# Patient Record
Sex: Female | Born: 1935 | Race: Black or African American | Hispanic: No | Marital: Single | Smoking: Never smoker
Health system: Southern US, Community
[De-identification: ages and names within clinical notes are randomized; demographics above are authoritative.]

## PROBLEM LIST (undated history)

## (undated) DIAGNOSIS — G4700 Insomnia, unspecified: Secondary | ICD-10-CM

## (undated) DIAGNOSIS — I10 Essential (primary) hypertension: Secondary | ICD-10-CM

## (undated) DIAGNOSIS — E039 Hypothyroidism, unspecified: Secondary | ICD-10-CM

## (undated) DIAGNOSIS — I509 Heart failure, unspecified: Secondary | ICD-10-CM

## (undated) DIAGNOSIS — K219 Gastro-esophageal reflux disease without esophagitis: Secondary | ICD-10-CM

## (undated) DIAGNOSIS — E119 Type 2 diabetes mellitus without complications: Secondary | ICD-10-CM

---

## 1999-01-19 ENCOUNTER — Ambulatory Visit (HOSPITAL_COMMUNITY): Admission: RE | Admit: 1999-01-19 | Discharge: 1999-01-19 | Payer: Self-pay | Admitting: Internal Medicine

## 1999-01-19 ENCOUNTER — Encounter: Payer: Self-pay | Admitting: Internal Medicine

## 1999-02-04 ENCOUNTER — Ambulatory Visit (HOSPITAL_COMMUNITY): Admission: RE | Admit: 1999-02-04 | Discharge: 1999-02-04 | Payer: Self-pay | Admitting: Gastroenterology

## 1999-02-10 ENCOUNTER — Encounter: Payer: Self-pay | Admitting: Internal Medicine

## 1999-02-10 ENCOUNTER — Ambulatory Visit (HOSPITAL_COMMUNITY): Admission: RE | Admit: 1999-02-10 | Discharge: 1999-02-10 | Payer: Self-pay | Admitting: Internal Medicine

## 2001-04-05 ENCOUNTER — Emergency Department (HOSPITAL_COMMUNITY): Admission: EM | Admit: 2001-04-05 | Discharge: 2001-04-05 | Payer: Self-pay | Admitting: Emergency Medicine

## 2002-01-19 ENCOUNTER — Ambulatory Visit (HOSPITAL_COMMUNITY): Admission: RE | Admit: 2002-01-19 | Discharge: 2002-01-19 | Payer: Self-pay | Admitting: General Surgery

## 2002-01-19 ENCOUNTER — Encounter: Payer: Self-pay | Admitting: General Surgery

## 2002-08-14 ENCOUNTER — Encounter: Payer: Self-pay | Admitting: Internal Medicine

## 2002-08-14 ENCOUNTER — Encounter: Admission: RE | Admit: 2002-08-14 | Discharge: 2002-08-14 | Payer: Self-pay | Admitting: Internal Medicine

## 2002-10-08 ENCOUNTER — Encounter: Payer: Self-pay | Admitting: Internal Medicine

## 2002-10-08 ENCOUNTER — Encounter: Admission: RE | Admit: 2002-10-08 | Discharge: 2002-10-08 | Payer: Self-pay | Admitting: Internal Medicine

## 2002-10-19 ENCOUNTER — Ambulatory Visit (HOSPITAL_COMMUNITY): Admission: RE | Admit: 2002-10-19 | Discharge: 2002-10-19 | Payer: Self-pay | Admitting: Gastroenterology

## 2002-11-19 ENCOUNTER — Ambulatory Visit (HOSPITAL_COMMUNITY): Admission: RE | Admit: 2002-11-19 | Discharge: 2002-11-19 | Payer: Self-pay | Admitting: Gastroenterology

## 2003-10-20 ENCOUNTER — Emergency Department (HOSPITAL_COMMUNITY): Admission: EM | Admit: 2003-10-20 | Discharge: 2003-10-20 | Payer: Self-pay | Admitting: Emergency Medicine

## 2004-08-28 ENCOUNTER — Ambulatory Visit (HOSPITAL_COMMUNITY): Admission: RE | Admit: 2004-08-28 | Discharge: 2004-08-28 | Payer: Self-pay | Admitting: Internal Medicine

## 2005-05-19 ENCOUNTER — Encounter: Admission: RE | Admit: 2005-05-19 | Discharge: 2005-05-19 | Payer: Self-pay | Admitting: Gastroenterology

## 2005-05-21 ENCOUNTER — Ambulatory Visit (HOSPITAL_COMMUNITY): Admission: RE | Admit: 2005-05-21 | Discharge: 2005-05-21 | Payer: Self-pay | Admitting: Gastroenterology

## 2012-02-12 ENCOUNTER — Emergency Department (HOSPITAL_COMMUNITY)
Admission: EM | Admit: 2012-02-12 | Discharge: 2012-02-12 | Disposition: A | Discharge status: Home / Self Care | Payer: Medicare Other | Attending: Emergency Medicine | Admitting: Emergency Medicine

## 2012-02-12 ENCOUNTER — Encounter (HOSPITAL_COMMUNITY): Payer: Self-pay

## 2012-02-12 ENCOUNTER — Emergency Department (HOSPITAL_COMMUNITY): Payer: Medicare Other

## 2012-02-12 DIAGNOSIS — I10 Essential (primary) hypertension: Secondary | ICD-10-CM | POA: Insufficient documentation

## 2012-02-12 DIAGNOSIS — K219 Gastro-esophageal reflux disease without esophagitis: Secondary | ICD-10-CM | POA: Insufficient documentation

## 2012-02-12 DIAGNOSIS — E039 Hypothyroidism, unspecified: Secondary | ICD-10-CM | POA: Insufficient documentation

## 2012-02-12 DIAGNOSIS — Z79899 Other long term (current) drug therapy: Secondary | ICD-10-CM | POA: Insufficient documentation

## 2012-02-12 DIAGNOSIS — N390 Urinary tract infection, site not specified: Secondary | ICD-10-CM

## 2012-02-12 DIAGNOSIS — M549 Dorsalgia, unspecified: Secondary | ICD-10-CM | POA: Insufficient documentation

## 2012-02-12 DIAGNOSIS — M7989 Other specified soft tissue disorders: Secondary | ICD-10-CM | POA: Insufficient documentation

## 2012-02-12 HISTORY — DX: Essential (primary) hypertension: I10

## 2012-02-12 HISTORY — DX: Hypothyroidism, unspecified: E03.9

## 2012-02-12 HISTORY — DX: Gastro-esophageal reflux disease without esophagitis: K21.9

## 2012-02-12 LAB — URINALYSIS, ROUTINE W REFLEX MICROSCOPIC
Hgb urine dipstick: NEGATIVE
Nitrite: POSITIVE — AB
Protein, ur: NEGATIVE mg/dL
Specific Gravity, Urine: 1.017 (ref 1.005–1.030)
Urobilinogen, UA: 1 mg/dL (ref 0.0–1.0)

## 2012-02-12 LAB — URINE MICROSCOPIC-ADD ON

## 2012-02-12 MED ORDER — SULFAMETHOXAZOLE-TRIMETHOPRIM 800-160 MG PO TABS: 1.0000 | ORAL_TABLET | Freq: Two times a day (BID) | ORAL | Status: DC

## 2012-02-12 NOTE — ED Notes (Signed)
YNW:[id number]<[id number]> Expected date:02/12/12<BR> Expected time:12:19 PM<BR> Means of arrival:Ambulance<BR> Comments:<BR> Flank pain

## 2012-02-12 NOTE — ED Notes (Signed)
Per EMS Pt began having left sided flank pain last night. Pt sleeps in her recliner and was moving from the recliner to the bedside commode and noticed the pain. The pain became unbarable with movement this AM. Pt only has pain with movement at this time. Pt hx HTN but did not take her BP medication this AM.

## 2012-02-12 NOTE — ED Provider Notes (Signed)
History     CSN: [id number]  Arrival date & time 02/12/12  1231   First MD Initiated Contact with Patient 02/12/12 1251      Chief Complaint  Patient presents with  . Flank Pain    (Consider location/radiation/quality/duration/timing/severity/associated sxs/prior treatment) HPI Comments: Jeanne Thomas presents for evaluation of back pain.  She reports that she has to go to the bathroom frequently to empty her bladder and late last night she felt a sharp pain while standing up.  She states she has no significant discomfort if she remains still but significant pain when she sits upright.  The pain was greatly reduced after taking "a pain pill".  She denies fever, chest pain, shortness of breath, NVD, palpitations, trauma, and rashes.   Past Medical History  Diagnosis Date  . Hypertension   . GERD (gastroesophageal reflux disease)   . Hypothyroidism     No past surgical history on file.  No family history on file.  History  Substance Use Topics  . Smoking status: Not on file  . Smokeless tobacco: Not on file  . Alcohol Use:     OB History    Grav Para Term Preterm Abortions TAB SAB Ect Mult Living                  Review of Systems  Constitutional: Negative for fever, activity change, appetite change and fatigue.  HENT: Negative for congestion, sore throat, rhinorrhea, trouble swallowing and neck pain.   Eyes: Negative.   Respiratory: Negative for cough, choking, chest tightness and shortness of breath.   Cardiovascular: Positive for leg swelling (Chronic). Negative for chest pain and palpitations.  Gastrointestinal: Negative for nausea, vomiting, abdominal pain, diarrhea and abdominal distention.  Genitourinary: Positive for frequency. Negative for dysuria, urgency, hematuria, flank pain, decreased urine volume, difficulty urinating and dyspareunia.  Musculoskeletal: Positive for back pain and arthralgias. Negative for joint swelling and gait problem.  Neurological:  Negative.  Negative for syncope, weakness and light-headedness.  Psychiatric/Behavioral: Negative.     Allergies  Review of patient's allergies indicates no known allergies.  Home Medications   Current Outpatient Rx  Name Route Sig Dispense Refill  . VITAMIN D3 2000 UNITS PO TABS Oral Take 2,000 mg by mouth daily.    . FUROSEMIDE 20 MG PO TABS Oral Take 20 mg by mouth 2 (two) times daily.    Marland Kitchen LEVOTHYROXINE SODIUM 25 MCG PO TABS Oral Take 25 mcg by mouth daily.    Marland Kitchen METOPROLOL SUCCINATE ER 25 MG PO TB24 Oral Take 25 mg by mouth 2 (two) times daily.    Marland Kitchen OMEPRAZOLE 20 MG PO CPDR Oral Take 40 mg by mouth daily.    Marland Kitchen POTASSIUM CHLORIDE CRYS ER 20 MEQ PO TBCR Oral Take 20 mEq by mouth daily.    Bernadette Hoit SODIUM 8.6-50 MG PO TABS Oral Take 1 tablet by mouth daily.    . TRAMADOL HCL 50 MG PO TABS Oral Take 50 mg by mouth every 12 (twelve) hours as needed. pain      BP 143/59  Pulse 91  Temp 97.8 F (36.6 C) (Oral)  SpO2 93%  Physical Exam  Nursing note and vitals reviewed. Constitutional: She is oriented to person, place, and time. She appears well-developed and well-nourished. No distress.  HENT:  Head: Normocephalic and atraumatic.  Right Ear: External ear normal.  Left Ear: External ear normal.  Nose: Nose normal.  Mouth/Throat: Oropharynx is clear and moist. No oropharyngeal exudate.  Eyes:  Conjunctivae normal are normal. Pupils are equal, round, and reactive to light. Right eye exhibits no discharge. Left eye exhibits no discharge. No scleral icterus.  Neck: Normal range of motion. Neck supple. No JVD present. No tracheal deviation present.  Cardiovascular: Normal rate, regular rhythm, normal heart sounds and intact distal pulses.  Exam reveals no gallop and no friction rub.   No murmur heard. Pulmonary/Chest: Breath sounds normal. No stridor. No respiratory distress. She has no wheezes. She has no rales. She exhibits no tenderness.  Abdominal: Soft. Bowel sounds  are normal. She exhibits no distension and no mass. There is no tenderness. There is no rebound and no guarding.  Musculoskeletal: Normal range of motion. She exhibits edema (1+ bilateral, symmetric).       Arms: Lymphadenopathy:    She has no cervical adenopathy.  Neurological: She is alert and oriented to person, place, and time. No cranial nerve deficit.  Skin: Skin is warm and dry. No rash noted. She is not diaphoretic. No erythema. No pallor.  Psychiatric: She has a normal mood and affect. Her behavior is normal.    ED Course  Procedures (including critical care time)   Labs Reviewed  URINALYSIS, ROUTINE W REFLEX MICROSCOPIC   No results found.   No diagnosis found.    MDM  Pt presents for evaluation of back pain.  She reports that she has to go to the bathroom frequently to empty her bladder and late last night she felt a sharp pain while standing up.  She states she has no significant discomfort if she remains still but significant pain when she sits upright.  Note stable VS, NAD.  Plan urinalysis and CXR.  Will reassess.  1520.  Pt stable, NAD.  Plan treat for UTI with Bactrim.  Urine culture has been sent for further evaluation.  She appears comfortable and her pain was improved after taking the ultram she has been previously prescribed.  Plan discharge home.      Tobin Chad, MD 02/12/12 1525

## 2012-02-14 LAB — URINE CULTURE

## 2012-02-15 NOTE — ED Notes (Signed)
+   Urine Patient treated with Bactrim-Sensitive to same-chart appended per protocol MD.

## 2012-03-17 ENCOUNTER — Emergency Department (HOSPITAL_COMMUNITY): Payer: Medicare Other

## 2012-03-17 ENCOUNTER — Emergency Department (HOSPITAL_COMMUNITY)
Admission: EM | Admit: 2012-03-17 | Discharge: 2012-03-17 | Disposition: A | Discharge status: Home / Self Care | Payer: Medicare Other | Attending: Emergency Medicine | Admitting: Emergency Medicine

## 2012-03-17 ENCOUNTER — Encounter (HOSPITAL_COMMUNITY): Payer: Self-pay | Admitting: Emergency Medicine

## 2012-03-17 DIAGNOSIS — J3489 Other specified disorders of nose and nasal sinuses: Secondary | ICD-10-CM | POA: Insufficient documentation

## 2012-03-17 DIAGNOSIS — J069 Acute upper respiratory infection, unspecified: Secondary | ICD-10-CM | POA: Insufficient documentation

## 2012-03-17 DIAGNOSIS — J329 Chronic sinusitis, unspecified: Secondary | ICD-10-CM | POA: Insufficient documentation

## 2012-03-17 DIAGNOSIS — R040 Epistaxis: Secondary | ICD-10-CM | POA: Insufficient documentation

## 2012-03-17 DIAGNOSIS — R6883 Chills (without fever): Secondary | ICD-10-CM | POA: Insufficient documentation

## 2012-03-17 DIAGNOSIS — K219 Gastro-esophageal reflux disease without esophagitis: Secondary | ICD-10-CM | POA: Insufficient documentation

## 2012-03-17 DIAGNOSIS — IMO0001 Reserved for inherently not codable concepts without codable children: Secondary | ICD-10-CM | POA: Insufficient documentation

## 2012-03-17 DIAGNOSIS — I10 Essential (primary) hypertension: Secondary | ICD-10-CM | POA: Insufficient documentation

## 2012-03-17 DIAGNOSIS — J029 Acute pharyngitis, unspecified: Secondary | ICD-10-CM | POA: Insufficient documentation

## 2012-03-17 DIAGNOSIS — R5383 Other fatigue: Secondary | ICD-10-CM | POA: Insufficient documentation

## 2012-03-17 DIAGNOSIS — R5381 Other malaise: Secondary | ICD-10-CM | POA: Insufficient documentation

## 2012-03-17 DIAGNOSIS — E039 Hypothyroidism, unspecified: Secondary | ICD-10-CM | POA: Insufficient documentation

## 2012-03-17 DIAGNOSIS — M7989 Other specified soft tissue disorders: Secondary | ICD-10-CM | POA: Insufficient documentation

## 2012-03-17 LAB — URINALYSIS, ROUTINE W REFLEX MICROSCOPIC
Bilirubin Urine: NEGATIVE
Glucose, UA: NEGATIVE mg/dL
Ketones, ur: NEGATIVE mg/dL
Protein, ur: NEGATIVE mg/dL
pH: 6.5 (ref 5.0–8.0)

## 2012-03-17 LAB — COMPREHENSIVE METABOLIC PANEL
ALT: 10 U/L (ref 0–35)
Albumin: 4.3 g/dL (ref 3.5–5.2)
Alkaline Phosphatase: 80 U/L (ref 39–117)
BUN: 13 mg/dL (ref 6–23)
Chloride: 95 mEq/L — ABNORMAL LOW (ref 96–112)
GFR calc Af Amer: >90 mL/min (ref >90)
Glucose, Bld: 128 mg/dL — ABNORMAL HIGH (ref 70–99)
Potassium: 3.9 mEq/L (ref 3.5–5.1)
Sodium: 135 mEq/L (ref 135–145)
Total Bilirubin: 0.6 mg/dL (ref 0.3–1.2)
Total Protein: 8.2 g/dL (ref 6.0–8.3)

## 2012-03-17 LAB — CBC WITH DIFFERENTIAL/PLATELET
Eosinophils Absolute: 0.1 10*3/uL (ref 0.0–0.7)
Hemoglobin: 13 g/dL (ref 12.0–15.0)
Lymphs Abs: 1.5 10*3/uL (ref 0.7–4.0)
Monocytes Relative: 12 % (ref 3–12)
Neutro Abs: 3.8 10*3/uL (ref 1.7–7.7)
Neutrophils Relative %: 62 % (ref 43–77)
Platelets: 208 10*3/uL (ref 150–400)
RBC: 4.16 MIL/uL (ref 3.87–5.11)
WBC: 6.1 10*3/uL (ref 4.0–10.5)

## 2012-03-17 MED ORDER — FLUTICASONE PROPIONATE 50 MCG/ACT NA SUSP: 2.0000 | Freq: Every day | NASAL | Status: DC

## 2012-03-17 MED ORDER — CETIRIZINE-PSEUDOEPHEDRINE ER 5-120 MG PO TB12: 1.0000 | ORAL_TABLET | Freq: Every day | ORAL | Status: DC

## 2012-03-17 NOTE — ED Notes (Addendum)
Pt from home.  Pt has had issue with her sinuses and feels that her ear is stopped up.  Pt has had ear irrigated in the past before.  Pt also has had increased swelling in both her feet.  Pt on lasix for htn and "fluid around the heart."  Pt also says she has a cough, but states it is the same

## 2012-03-17 NOTE — ED Provider Notes (Signed)
See prior note   Ward Givens, MD 03/17/12 1733

## 2012-03-17 NOTE — ED Provider Notes (Signed)
Patient reports for the past couple days she has had a pressure in both her ears and has a stuffy nose without rhinorrhea. She states when she clears her throat she coughs up some phlegm although she's not coughing. She denies shortness of breath or chest pain. She states she has urinary frequency however she was recently started on Lasix and states she appears to be urinating less since she was started on the Lasix. She relates she's had some chills either while she was in the ED. They are unsure of any fever.  Patient is alert and cooperative sitting in a wheelchair she is not in any respiratory distress, she has no obvious rhinorrhea.  Medical screening examination/treatment/procedure(s) were conducted as a shared visit with non-physician practitioner(s) and myself.  I personally evaluated the patient during the encounter Devoria Albe, MD, Franz Dell, MD 03/17/12 [phone]

## 2012-03-17 NOTE — Progress Notes (Signed)
Pt confirms pcp is henry tripp States generally seen by his Charity fundraiser. EPIC updated

## 2012-03-17 NOTE — ED Provider Notes (Signed)
History     CSN: [id number]  Arrival date & time 03/17/12  1424   First MD Initiated Contact with Patient 03/17/12 1508      Chief Complaint  Patient presents with  . Otalgia  . Foot Swelling    (Consider location/radiation/quality/duration/timing/severity/associated sxs/prior treatment) HPI Jeanne Thomas is a 78 y.o. female who presents here from home with complaint of ear pain and itching, nasal congestion, cough. Pt states she has felt "bad" last several days. Pt states had similar ear problem before, and was told it was ear wax. She has used over the counter ear wax drops with no relief. Pt also reports chronic sinus problems, not taking any medications for it. States that she has been sneezing a lot. Also reports sore throat and mild non productive cough. States legs have been swelling more than usual, and no relief with lasix which normally helps. Pt denies fever, chills. No n/v/d. No headache, chest pain, abdominal pain, urinary symptoms.    Past Medical History  Diagnosis Date  . Hypertension   . GERD (gastroesophageal reflux disease)   . Hypothyroidism     History reviewed. No pertinent past surgical history.  History reviewed. No pertinent family history.  History  Substance Use Topics  . Smoking status: Never Smoker   . Smokeless tobacco: Not on file  . Alcohol Use: No    OB History    Grav Para Term Preterm Abortions TAB SAB Ect Mult Living                  Review of Systems  Constitutional: Positive for chills and fatigue. Negative for fever.  HENT: Positive for ear pain, nosebleeds, congestion, sore throat and sneezing. Negative for hearing loss, trouble swallowing, neck pain, neck stiffness, voice change and ear discharge.   Eyes: Negative for discharge and itching.  Cardiovascular: Positive for leg swelling. Negative for chest pain and palpitations.  Gastrointestinal: Negative for nausea, vomiting, diarrhea, constipation and abdominal distention.    Genitourinary: Negative for dysuria and flank pain.  Musculoskeletal: Positive for myalgias.  Skin: Negative.   Neurological: Positive for weakness. Negative for dizziness, numbness and headaches.  Psychiatric/Behavioral: Negative.     Allergies  Review of patient's allergies indicates no known allergies.  Home Medications   Current Outpatient Rx  Name  Route  Sig  Dispense  Refill  . VITAMIN D3 2000 UNITS PO TABS   Oral   Take 2,000 mg by mouth daily.         . FUROSEMIDE 20 MG PO TABS   Oral   Take 20 mg by mouth 2 (two) times daily.         Marland Kitchen LEVOTHYROXINE SODIUM 25 MCG PO TABS   Oral   Take 25 mcg by mouth daily.         Marland Kitchen METOPROLOL SUCCINATE ER 25 MG PO TB24   Oral   Take 25 mg by mouth 2 (two) times daily.         Marland Kitchen OMEPRAZOLE 20 MG PO CPDR   Oral   Take 40 mg by mouth daily.         Marland Kitchen POTASSIUM CHLORIDE CRYS ER 20 MEQ PO TBCR   Oral   Take 20 mEq by mouth daily.         Bernadette Hoit SODIUM 8.6-50 MG PO TABS   Oral   Take 1 tablet by mouth daily.         . TRAMADOL HCL 50 MG  PO TABS   Oral   Take 50 mg by mouth every 12 (twelve) hours as needed. pain           BP 170/73  Pulse 102  Temp 97.5 F (36.4 C) (Oral)  Resp 16  SpO2 97%  Physical Exam  Nursing note and vitals reviewed. Constitutional: She appears well-developed and well-nourished. No distress.  HENT:  Head: Normocephalic and atraumatic.  Right Ear: Tympanic membrane, external ear and ear canal normal.  Left Ear: Tympanic membrane, external ear and ear canal normal.  Nose: Rhinorrhea present. Right sinus exhibits no maxillary sinus tenderness and no frontal sinus tenderness. Left sinus exhibits no maxillary sinus tenderness and no frontal sinus tenderness.  Mouth/Throat: Uvula is midline, oropharynx is clear and moist and mucous membranes are normal. No oropharyngeal exudate, posterior oropharyngeal edema, posterior oropharyngeal erythema or tonsillar abscesses.   Eyes: Conjunctivae normal are normal.  Neck: Neck supple.  Cardiovascular: Normal rate, regular rhythm and normal heart sounds.   Pulmonary/Chest: Effort normal and breath sounds normal. No respiratory distress. She has no wheezes. She has no rales.  Abdominal: Soft. Bowel sounds are normal. She exhibits no distension. There is no tenderness. There is no rebound.  Musculoskeletal:       Non pitting LE edema bilaterally  Neurological: She is alert.  Skin: Skin is warm and dry.  Psychiatric: She has a normal mood and affect. Her behavior is normal.    ED Course  Procedures (including critical care time)  Results for orders placed during the hospital encounter of 03/17/12  CBC WITH DIFFERENTIAL      Component Value Range   WBC 6.1  4.0 - 10.5 K/uL   RBC 4.16  3.87 - 5.11 MIL/uL   Hemoglobin 13.0  12.0 - 15.0 g/dL   HCT 16.1  09.6 - 04.5 %   MCV 91.8  78.0 - 100.0 fL   MCH 31.3  26.0 - 34.0 pg   MCHC 34.0  30.0 - 36.0 g/dL   RDW 40.9  81.1 - 91.4 %   Platelets 208  150 - 400 K/uL   Neutrophils Relative 62  43 - 77 %   Neutro Abs 3.8  1.7 - 7.7 K/uL   Lymphocytes Relative 24  12 - 46 %   Lymphs Abs 1.5  0.7 - 4.0 K/uL   Monocytes Relative 12  3 - 12 %   Monocytes Absolute 0.7  0.1 - 1.0 K/uL   Eosinophils Relative 2  0 - 5 %   Eosinophils Absolute 0.1  0.0 - 0.7 K/uL   Basophils Relative 1  0 - 1 %   Basophils Absolute 0.1  0.0 - 0.1 K/uL  COMPREHENSIVE METABOLIC PANEL      Component Value Range   Sodium 135  135 - 145 mEq/L   Potassium 3.9  3.5 - 5.1 mEq/L   Chloride 95 (*) 96 - 112 mEq/L   CO2 27  19 - 32 mEq/L   Glucose, Bld 128 (*) 70 - 99 mg/dL   BUN 13  6 - 23 mg/dL   Creatinine, Ser 7.82  0.50 - 1.10 mg/dL   Calcium 95.6  8.4 - 21.3 mg/dL   Total Protein 8.2  6.0 - 8.3 g/dL   Albumin 4.3  3.5 - 5.2 g/dL   AST 17  0 - 37 U/L   ALT 10  0 - 35 U/L   Alkaline Phosphatase 80  39 - 117 U/L   Total Bilirubin 0.6  0.3 - 1.2 mg/dL   GFR calc non Af Amer 80 (*) >90  mL/min   GFR calc Af Amer >90  >90 mL/min  URINALYSIS, ROUTINE W REFLEX MICROSCOPIC      Component Value Range   Color, Urine YELLOW  YELLOW   APPearance CLEAR  CLEAR   Specific Gravity, Urine 1.007  1.005 - 1.030   pH 6.5  5.0 - 8.0   Glucose, UA NEGATIVE  NEGATIVE mg/dL   Hgb urine dipstick NEGATIVE  NEGATIVE   Bilirubin Urine NEGATIVE  NEGATIVE   Ketones, ur NEGATIVE  NEGATIVE mg/dL   Protein, ur NEGATIVE  NEGATIVE mg/dL   Urobilinogen, UA 0.2  0.0 - 1.0 mg/dL   Nitrite NEGATIVE  NEGATIVE   Leukocytes, UA NEGATIVE  NEGATIVE   Dg Chest 2 View  03/17/2012  *RADIOLOGY REPORT*  Clinical Data: Shortness of breath and cough.  CHEST - 2 VIEW  Comparison: Plain films of the chest 02/12/2012.  Findings: There is cardiomegaly but no edema.  Lungs are clear.  No pneumothorax or pleural fluid.  Marked degenerative change about the shoulders and multilevel thoracic spondylosis noted.  IMPRESSION: Cardiomegaly without acute disease.   Original Report Authenticated By: Holley Dexter, M.D.     Filed Vitals:   03/17/12 1658  BP: 128/59  Pulse: 85  Temp:   Resp: 16     1. URI (upper respiratory infection)   2. Sinusitis       MDM  Pt with URI symptoms, cough, chills. VS normal here. She is non toxic. Labs and UA negative. Chest x-ray negative. I suspect pt has a viral URI. Will start on zyrtec, flonase. Will follow up with PCP.         Lottie Mussel, PA 03/17/12 1719

## 2012-04-29 ENCOUNTER — Encounter (HOSPITAL_COMMUNITY): Payer: Self-pay | Admitting: Emergency Medicine

## 2012-04-29 ENCOUNTER — Observation Stay (HOSPITAL_COMMUNITY): Payer: Medicare Other

## 2012-04-29 ENCOUNTER — Inpatient Hospital Stay (HOSPITAL_COMMUNITY)
Admission: EM | Admit: 2012-04-29 | Discharge: 2012-05-03 | DRG: 490 | Disposition: A | Discharge status: Skilled Nursing Facility | Payer: Medicare Other | Attending: Internal Medicine | Admitting: Internal Medicine

## 2012-04-29 ENCOUNTER — Emergency Department (HOSPITAL_COMMUNITY): Payer: Medicare Other

## 2012-04-29 DIAGNOSIS — E119 Type 2 diabetes mellitus without complications: Secondary | ICD-10-CM

## 2012-04-29 DIAGNOSIS — M4626 Osteomyelitis of vertebra, lumbar region: Secondary | ICD-10-CM | POA: Insufficient documentation

## 2012-04-29 DIAGNOSIS — R531 Weakness: Secondary | ICD-10-CM

## 2012-04-29 DIAGNOSIS — M549 Dorsalgia, unspecified: Secondary | ICD-10-CM

## 2012-04-29 DIAGNOSIS — E049 Nontoxic goiter, unspecified: Secondary | ICD-10-CM | POA: Diagnosis present

## 2012-04-29 DIAGNOSIS — M4640 Discitis, unspecified, site unspecified: Secondary | ICD-10-CM

## 2012-04-29 DIAGNOSIS — E669 Obesity, unspecified: Secondary | ICD-10-CM | POA: Diagnosis present

## 2012-04-29 DIAGNOSIS — K219 Gastro-esophageal reflux disease without esophagitis: Secondary | ICD-10-CM

## 2012-04-29 DIAGNOSIS — I5032 Chronic diastolic (congestive) heart failure: Secondary | ICD-10-CM

## 2012-04-29 DIAGNOSIS — E039 Hypothyroidism, unspecified: Secondary | ICD-10-CM

## 2012-04-29 DIAGNOSIS — I10 Essential (primary) hypertension: Secondary | ICD-10-CM

## 2012-04-29 DIAGNOSIS — M519 Unspecified thoracic, thoracolumbar and lumbosacral intervertebral disc disorder: Principal | ICD-10-CM | POA: Diagnosis present

## 2012-04-29 LAB — CBC
Hemoglobin: 12.2 g/dL (ref 12.0–15.0)
MCH: 29.8 pg (ref 26.0–34.0)
MCV: 92.2 fL (ref 78.0–100.0)
RBC: 4.09 MIL/uL (ref 3.87–5.11)

## 2012-04-29 LAB — CBC WITH DIFFERENTIAL/PLATELET
Basophils Absolute: 0.1 10*3/uL (ref 0.0–0.1)
Basophils Relative: 1 % (ref 0–1)
Lymphocytes Relative: 16 % (ref 12–46)
Neutro Abs: 6.4 10*3/uL (ref 1.7–7.7)
Platelets: 188 10*3/uL (ref 150–400)
RDW: 14.8 % (ref 11.5–15.5)
WBC: 9.3 10*3/uL (ref 4.0–10.5)

## 2012-04-29 LAB — COMPREHENSIVE METABOLIC PANEL
ALT: 8 U/L (ref 0–35)
AST: 19 U/L (ref 0–37)
Albumin: 3.9 g/dL (ref 3.5–5.2)
CO2: 27 mEq/L (ref 19–32)
Calcium: 10.1 mg/dL (ref 8.4–10.5)
Chloride: 94 mEq/L — ABNORMAL LOW (ref 96–112)
GFR calc non Af Amer: 78 mL/min — ABNORMAL LOW (ref >90)
Sodium: 133 mEq/L — ABNORMAL LOW (ref 135–145)
Total Bilirubin: 0.8 mg/dL (ref 0.3–1.2)

## 2012-04-29 LAB — URINALYSIS, ROUTINE W REFLEX MICROSCOPIC
Bilirubin Urine: NEGATIVE
Hgb urine dipstick: NEGATIVE
Ketones, ur: NEGATIVE mg/dL
Leukocytes, UA: NEGATIVE
Nitrite: NEGATIVE
Specific Gravity, Urine: 1.018 (ref 1.005–1.030)
pH: 6 (ref 5.0–8.0)

## 2012-04-29 LAB — CREATININE, SERUM: Creatinine, Ser: 0.51 mg/dL (ref 0.50–1.10)

## 2012-04-29 MED ORDER — FUROSEMIDE 20 MG PO TABS
20.0000 mg | ORAL_TABLET | Freq: Two times a day (BID) | ORAL | Status: DC
  Administered 2012-04-29 – 2012-05-03 (×8): 20 mg via ORAL
  Filled 2012-04-29 (×9): qty 1

## 2012-04-29 MED ORDER — TRAMADOL HCL 50 MG PO TABS
50.0000 mg | ORAL_TABLET | Freq: Two times a day (BID) | ORAL | Status: DC | PRN
  Administered 2012-04-29: 50 mg via ORAL
  Filled 2012-04-29 (×2): qty 1

## 2012-04-29 MED ORDER — METOPROLOL SUCCINATE ER 25 MG PO TB24
25.0000 mg | ORAL_TABLET | Freq: Two times a day (BID) | ORAL | Status: DC
  Administered 2012-04-29 – 2012-05-03 (×8): 25 mg via ORAL
  Filled 2012-04-29 (×9): qty 1

## 2012-04-29 MED ORDER — SENNOSIDES-DOCUSATE SODIUM 8.6-50 MG PO TABS
1.0000 | ORAL_TABLET | Freq: Every day | ORAL | Status: DC
  Administered 2012-04-30 – 2012-05-02 (×3): 1 via ORAL
  Filled 2012-04-29 (×3): qty 1

## 2012-04-29 MED ORDER — ENOXAPARIN SODIUM 30 MG/0.3ML ~~LOC~~ SOLN
30.0000 mg | SUBCUTANEOUS | Status: DC
  Administered 2012-04-29: 30 mg via SUBCUTANEOUS
  Filled 2012-04-29 (×2): qty 0.3

## 2012-04-29 MED ORDER — ACETAMINOPHEN 325 MG PO TABS
650.0000 mg | ORAL_TABLET | Freq: Four times a day (QID) | ORAL | Status: DC | PRN
  Administered 2012-04-30: 650 mg via ORAL
  Filled 2012-04-29: qty 2

## 2012-04-29 MED ORDER — ONDANSETRON HCL 4 MG PO TABS: 4.0000 mg | ORAL_TABLET | Freq: Four times a day (QID) | ORAL | Status: DC | PRN

## 2012-04-29 MED ORDER — AMLODIPINE-OLMESARTAN 5-20 MG PO TABS: 1.0000 | ORAL_TABLET | Freq: Every day | ORAL | Status: DC

## 2012-04-29 MED ORDER — ONDANSETRON HCL 4 MG/2ML IJ SOLN: 4.0000 mg | Freq: Four times a day (QID) | INTRAMUSCULAR | Status: DC | PRN

## 2012-04-29 MED ORDER — AMLODIPINE BESYLATE 5 MG PO TABS
5.0000 mg | ORAL_TABLET | Freq: Every day | ORAL | Status: DC
  Administered 2012-04-29 – 2012-05-03 (×5): 5 mg via ORAL
  Filled 2012-04-29 (×5): qty 1

## 2012-04-29 MED ORDER — IRBESARTAN 150 MG PO TABS
150.0000 mg | ORAL_TABLET | Freq: Every day | ORAL | Status: DC
  Administered 2012-04-29 – 2012-05-03 (×5): 150 mg via ORAL
  Filled 2012-04-29 (×5): qty 1

## 2012-04-29 MED ORDER — LEVOTHYROXINE SODIUM 25 MCG PO TABS
25.0000 ug | ORAL_TABLET | Freq: Every day | ORAL | Status: DC
  Administered 2012-04-30 – 2012-05-03 (×3): 25 ug via ORAL
  Filled 2012-04-29 (×5): qty 1

## 2012-04-29 MED ORDER — OXYCODONE HCL 5 MG PO TABS: 5.0000 mg | ORAL_TABLET | ORAL | Status: DC | PRN

## 2012-04-29 MED ORDER — FLUTICASONE PROPIONATE 50 MCG/ACT NA SUSP
2.0000 | Freq: Every day | NASAL | Status: DC
  Administered 2012-04-29 – 2012-05-03 (×5): 2 via NASAL
  Filled 2012-04-29: qty 16

## 2012-04-29 MED ORDER — PANTOPRAZOLE SODIUM 40 MG PO TBEC
40.0000 mg | DELAYED_RELEASE_TABLET | Freq: Every day | ORAL | Status: DC
  Administered 2012-04-30 – 2012-05-03 (×4): 40 mg via ORAL
  Filled 2012-04-29 (×4): qty 1

## 2012-04-29 MED ORDER — ACETAMINOPHEN 650 MG RE SUPP: 650.0000 mg | Freq: Four times a day (QID) | RECTAL | Status: DC | PRN

## 2012-04-29 NOTE — ED Notes (Signed)
Patient cleaned and changed bed.

## 2012-04-29 NOTE — ED Notes (Signed)
Per EMS - pt reports last night she started to have some UTI symptoms along with lower back pain. Pt also c/o ear pain. Pt a&Ox4. BP 140/76 HR 84 RR 14.

## 2012-04-29 NOTE — ED Notes (Signed)
Lab at bedside collecting blood samples.  

## 2012-04-29 NOTE — ED Notes (Signed)
Patient transported to X-ray 

## 2012-04-29 NOTE — ED Notes (Signed)
MD at bedside, admission Md

## 2012-04-29 NOTE — ED Notes (Signed)
No Admitting orders

## 2012-04-29 NOTE — ED Provider Notes (Signed)
2:07 PM  Date: 04/29/2012  Rate: 86  Rhythm: normal sinus rhythm  QRS Axis: normal  Intervals: normal QRS:  Poor R wave progression in precordial leads suggests old anterior myocardial infarction.  Left atrial abnormality.  ST/T Wave abnormalities: normal  Conduction Disutrbances:none  Narrative Interpretation: Borderline EKG  Old EKG Reviewed: changes noted--poor R wave progression in precordial leads is new since tracing of 10/20/2003.    Carleene Cooper III, MD 04/29/12 1425

## 2012-04-29 NOTE — ED Provider Notes (Signed)
History     CSN: [id number]  Arrival date & time 04/29/12  1256   First MD Initiated Contact with Patient 04/29/12 1324      Chief Complaint  Patient presents with  . Urinary Tract Infection    (Consider location/radiation/quality/duration/timing/severity/associated sxs/prior treatment) HPI 79 year old female presents to the emergency department with multiple complaints.  Patient states that she has lower back pain and burning with urination.  Pain is predominantly in the right flank.   She has had increased weakness. She is normally able to wak She has multiple joint complaints including her left knee, and left neck.  Today she had so much pain in her left knee and back that hat she was unable to get out of her recliner  to urinate.  The patient lives alone.  Her sister who is unable to physically lift her from the floor or bad takes care of her in the evenings.  She has a Press photographer who sits with her approximately 5 hours a day.  The patient does have a primary care physician, however she is unable to leave the house or getting in cars due to her inability to ambulate or lift her leg into the car.  She states that she has not seen her primary care physician in years.  He generally since nurses to her home for care. Patient denies any chest pain shortness of breath.  She does have bilateral peripheral edema.  She denies any altered mental status.  Family agrees that she is at baseline.  .  Past Medical History  Diagnosis Date  . Hypertension   . GERD (gastroesophageal reflux disease)   . Hypothyroidism     History reviewed. No pertinent past surgical history.  No family history on file.  History  Substance Use Topics  . Smoking status: Never Smoker   . Smokeless tobacco: Not on file  . Alcohol Use: No    OB History    Grav Para Term Preterm Abortions TAB SAB Ect Mult Living                  Review of Systems Ten systems reviewed and are negative for acute  change, except as noted in the HPI.   Allergies  Review of patient's allergies indicates no known allergies.  Home Medications   Current Outpatient Rx  Name  Route  Sig  Dispense  Refill  . AMLODIPINE-OLMESARTAN 5-20 MG PO TABS   Oral   Take 1 tablet by mouth daily.         Marland Kitchen VITAMIN D3 2000 UNITS PO TABS   Oral   Take 2,000 mg by mouth daily.         Marland Kitchen FLUTICASONE PROPIONATE 50 MCG/ACT NA SUSP   Nasal   Place 2 sprays into the nose daily.   16 g   1   . FUROSEMIDE 20 MG PO TABS   Oral   Take 20 mg by mouth 2 (two) times daily.         Marland Kitchen LEVOTHYROXINE SODIUM 25 MCG PO TABS   Oral   Take 25 mcg by mouth daily.         Marland Kitchen METOPROLOL SUCCINATE ER 25 MG PO TB24   Oral   Take 25 mg by mouth 2 (two) times daily.         Marland Kitchen OMEPRAZOLE 20 MG PO CPDR   Oral   Take 40 mg by mouth daily.         Marland Kitchen  POTASSIUM CHLORIDE CRYS ER 20 MEQ PO TBCR   Oral   Take 20 mEq by mouth daily.         Bernadette Hoit SODIUM 8.6-50 MG PO TABS   Oral   Take 1 tablet by mouth daily.         . TRAMADOL HCL 50 MG PO TABS   Oral   Take 50 mg by mouth every 12 (twelve) hours as needed. pain           BP 154/98  Pulse 89  Temp 98.1 F (36.7 C) (Oral)  Resp 22  SpO2 94%  Physical Exam  Constitutional: She is oriented to person, place, and time. She appears well-developed and well-nourished. No distress.  HENT:  Head: Normocephalic and atraumatic.  Eyes: Conjunctivae normal are normal. No scleral icterus.  Neck: Normal range of motion.  Cardiovascular: Normal rate, regular rhythm and normal heart sounds.  Exam reveals no gallop and no friction rub.   No murmur heard.      BL pitting edema of the lower extremities   Pulmonary/Chest: Effort normal and breath sounds normal. No respiratory distress.  Abdominal: Soft. Bowel sounds are normal. She exhibits no distension and no mass. There is no tenderness. There is no guarding.  Neurological: She is alert and oriented  to person, place, and time.  Skin: Skin is warm and dry. She is not diaphoretic.    ED Course  Procedures (including critical care time)  Labs Reviewed  CBC WITH DIFFERENTIAL - Abnormal; Notable for the following:    Monocytes Relative 13 (*)     Monocytes Absolute 1.2 (*)     All other components within normal limits  COMPREHENSIVE METABOLIC PANEL - Abnormal; Notable for the following:    Sodium 133 (*)     Chloride 94 (*)     Glucose, Bld 121 (*)     GFR calc non Af Amer 78 (*)     GFR calc Af Amer 90 (*)     All other components within normal limits  PRO B NATRIURETIC PEPTIDE - Abnormal; Notable for the following:    Pro B Natriuretic peptide (BNP) 580.5 (*)     All other components within normal limits  URINALYSIS, ROUTINE W REFLEX MICROSCOPIC   No results found.   1. Weakness   2. Acute back pain   3. Chronic diastolic heart failure   4. HTN (hypertension)   5. GERD (gastroesophageal reflux disease)       MDM  2:38 PM Filed Vitals:   04/29/12 1432  BP: 147/66  Pulse: 85  Temp:   Resp: 17   Patient with labs that are unremarkable.  She has multiple joint complaints, howver nothing seems acute and she has known arthritis.  The patient is unable to ambulate and does not feel safe to go home.  I have spoken with Dr. Rito Ehrlich who has agreed to admit the patient for observation and social work consult. Patient seen in shared visit with Dr. Ignacia Palma. The patient appears reasonably stabilized for admission considering the current resources, flow, and capabilities available in the ED at this time, and I doubt any other [facility] requiring further screening and/or treatment in the ED prior to admission.        Arthor Captain, PA-C 04/30/12 2114

## 2012-04-29 NOTE — H&P (Signed)
[facility] History and Physical  Jeanne Thomas ZOX:[id number] DOB: 1917/07/25 DOA: 04/29/2012  Referring physician: Arthor Captain, ER PA PCP: Florentina Jenny, MD  Specialists: None  Chief Complaint: Back pain  HPI: Jeanne Thomas is a 79 y.o. female  With past medical history of hypothyroidism, hypertension and diastolic heart failure who lives at home by herself. She normally spends most of her day in a chair, but is able to get up and with the use of the walker ambulate to the bathroom. For the past few days she's had worsening back pain in her lower back to the point where she cannot even get up. Her sister who lives close by visited her and was unable to help her get up. She brought in by ambulance to the emergency room. In the emergency room urinalysis was unremarkable. Initial blood work and chest x-ray unremarkable. Spinal x-rays were not done. Vital signs were noted to be stable. Hospitalists were called for further evaluation.  Review of Systems: When I saw the patient in the emergency room, she was doing okay. She denied any headaches, vision changes, dysphasia, chest pain, palpitations, shortness of breath, wheeze, cough, abdominal pain, hematuria. Patient complains of occasional urinary incontinence. She denies any diarrhea. She does have occasional episodes of constipation. She denies any lower extremity numbness but does have problems with lower from a swelling. She does complain of some pain in her lower back which is acute although she admits she's had this before. Her review of systems otherwise negative.  Past Medical History  Diagnosis Date  . Hypertension   . GERD (gastroesophageal reflux disease)   . Hypothyroidism    History reviewed. No pertinent past surgical history. Social History:  reports that she has never smoked. She does not have any smokeless tobacco history on file. She reports that she does not drink alcohol or use illicit drugs. Patient lives at home  by herself. His sister lives nearby and occasionally checks on her. Patient spends most of her day in a chair but is able to get herself up so she can ambulate using a walker to the bathroom.  No Known Allergies  Family history: Hypertension  Prior to Admission medications   Medication Sig Start Date End Date Taking? Authorizing Provider  amLODipine-olmesartan (AZOR) 5-20 MG per tablet Take 1 tablet by mouth daily.   Yes Historical Provider, MD  Cholecalciferol (VITAMIN D3) 2000 UNITS TABS Take 2,000 mg by mouth daily.   Yes Historical Provider, MD  fluticasone (FLONASE) 50 MCG/ACT nasal spray Place 2 sprays into the nose daily. 03/17/12  Yes Tatyana A Kirichenko, PA  furosemide (LASIX) 20 MG tablet Take 20 mg by mouth 2 (two) times daily.   Yes Historical Provider, MD  levothyroxine (SYNTHROID, LEVOTHROID) 25 MCG tablet Take 25 mcg by mouth daily.   Yes Historical Provider, MD  metoprolol succinate (TOPROL-XL) 25 MG 24 hr tablet Take 25 mg by mouth 2 (two) times daily.   Yes Historical Provider, MD  omeprazole (PRILOSEC) 20 MG capsule Take 40 mg by mouth daily.   Yes Historical Provider, MD  potassium chloride SA (K-DUR,KLOR-CON) 20 MEQ tablet Take 20 mEq by mouth daily.   Yes Historical Provider, MD  senna-docusate (SENOKOT-S) 8.6-50 MG per tablet Take 1 tablet by mouth daily.   Yes Historical Provider, MD  traMADol (ULTRAM) 50 MG tablet Take 50 mg by mouth every 12 (twelve) hours as needed. pain   Yes Historical Provider, MD   Physical Exam: Filed Vitals:  04/29/12 1302 04/29/12 1430 04/29/12 1432 04/29/12 1440  BP: 154/98 143/68 147/66   Pulse: 89 82 85   Temp: 98.1 F (36.7 C)   98.3 F (36.8 C)  TempSrc: Oral   Oral  Resp: 22 16 17    SpO2: 94% 95% 94%      General:  Alert and oriented x3, no acute distress, fatigued, looks younger than stated age  Eyes: Sclera nonicteric, extraocular movements are intact  ENT: Normocephalic and atraumatic, mucous membranes are slightly  dry  Neck: Neck is supple, no appreciable thyromegaly  Cardiovascular: Regular rate and rhythm, S1-S2, 2/6 systolic ejection murmur  Respiratory: Clear to auscultation bilaterally  Abdomen: Soft, obese, nontender, positive bowel sounds  Skin: No acute skin breaks, tears or lesions  Musculoskeletal: No clubbing or cyanosis, 1+ pitting edema from midcalf down bilaterally  Psychiatric: Patient appropriate, no evidence of psychoses  Neurologic: No overt deficits  Labs on Admission:  Basic Metabolic Panel:  Lab 04/29/12 1610  NA 133*  K 4.2  CL 94*  CO2 27  GLUCOSE 121*  BUN 15  CREATININE 0.58  CALCIUM 10.1  MG --  PHOS --   Liver Function Tests:  Lab 04/29/12 1337  AST 19  ALT 8  ALKPHOS 70  BILITOT 0.8  PROT 8.3  ALBUMIN 3.9   CBC:  Lab 04/29/12 1337  WBC 9.3  NEUTROABS 6.4  HGB 13.0  HCT 39.2  MCV 94.0  PLT 188    BNP (last 3 results)  Basename 04/29/12 1348  PROBNP 580.5*    Radiological Exams on Admission: Dg Chest 2 View  04/29/2012  IMPRESSION: Suboptimal inspiration.  Stable mild cardiomegaly.  No acute cardiopulmonary disease.   Original Report Authenticated By: Hulan Saas, M.D.     EKG: Independently reviewed. Sinus rhythm, borderline tachycardia  Assessment/Plan Principal Problem:  *Acute back pain: May be compression fracture versus arthritis. Check spinal x-rays. Consult PT. If there is no x-ray, home tomorrow with home health. If there is, we'll discuss with interventional radiology.  Active Problems:  Chronic diastolic heart failure: Stable. BNP only minimally elevated. Continue home Lasix and check strict I/O.   HTN (hypertension): Stable. Continue home meds  Obesity  DM type 2 (diabetes mellitus, type 2): Well-controlled with diet. Check morning CBG   GERD (gastroesophageal reflux disease): Stable. Continue PPI.   Hypothyroid: Stable. Check TSH. Continue Synthroid.   Code Status: Full code. Confirmed with  patient.  Family Communication: Plan discussed with patient and her sister who is at the bedside.  Disposition Plan: I must x-ray show acute emergent compression fracture, patient will likely go home tomorrow with home health  Time spent: 25 minutes  Hollice Espy [facility] Pager [phone]  If 7PM-7AM, please contact night-coverage [email or web address] Password [email or web address] 04/29/2012, 6:00 PM

## 2012-04-30 ENCOUNTER — Observation Stay (HOSPITAL_COMMUNITY): Payer: Medicare Other

## 2012-04-30 DIAGNOSIS — K219 Gastro-esophageal reflux disease without esophagitis: Secondary | ICD-10-CM

## 2012-04-30 DIAGNOSIS — M519 Unspecified thoracic, thoracolumbar and lumbosacral intervertebral disc disorder: Secondary | ICD-10-CM

## 2012-04-30 MED ORDER — ACETAMINOPHEN 500 MG PO TABS
1000.0000 mg | ORAL_TABLET | Freq: Three times a day (TID) | ORAL | Status: DC
  Administered 2012-04-30 – 2012-05-03 (×8): 1000 mg via ORAL
  Filled 2012-04-30 (×12): qty 2

## 2012-04-30 MED ORDER — ENOXAPARIN SODIUM 30 MG/0.3ML ~~LOC~~ SOLN
30.0000 mg | SUBCUTANEOUS | Status: DC
  Administered 2012-04-30 – 2012-05-02 (×3): 30 mg via SUBCUTANEOUS
  Filled 2012-04-30 (×4): qty 0.3

## 2012-04-30 MED ORDER — GADOBENATE DIMEGLUMINE 529 MG/ML IV SOLN: 17.0000 mL | Freq: Once | INTRAVENOUS | Status: AC | PRN

## 2012-04-30 NOTE — ED Provider Notes (Signed)
Medical screening examination/treatment/procedure(s) were conducted as a shared visit with non-physician practitioner(s) and myself.  I personally evaluated the patient during the encounter 79 yo woman with longstanding arthritis of both knees presents with lower back pain and inability to get up and walk with her walker.  Advised admission for PT evaluation, ? Rehab placement.  Carleene Cooper III, MD 04/30/12 2126

## 2012-04-30 NOTE — Progress Notes (Signed)
Physical Therapy Evaluation Patient Details Name: Jeanne Thomas MRN: [id number] DOB: 08-30-1917 Today's Date: 04/30/2012 Time: 4098-1191 PT Time Calculation (min): 32 min  PT Assessment / Plan / Recommendation Clinical Impression  79 yo admitted with acute low back pain, MRI showing possible discitis along with degenerative change, etc; Presents with significantly decr mobility; Will benefot form PT tomaxiize independence and safety with mobility, and to facilitate dc planning; At this point, we must consider an inpatient rehab situation for Ms. Colligan as she requires extensive assist with mobility    PT Assessment  Patient needs continued PT services    Follow Up Recommendations  CIR (maybe SNF)   Does the patient have the potential to tolerate intense rehabilitation      Barriers to Discharge Decreased caregiver support      Equipment Recommendations   (to be determined)    Recommendations for Other Services OT consult;Rehab consult   Frequency Min 3X/week    Precautions / Restrictions Precautions Precautions: Fall   Pertinent Vitals/Pain Pain with moving; pt did not rate; was positioned as comfortably as possible in recliner      Mobility  Bed Mobility Bed Mobility: Supine to Sit;Sitting - Scoot to Edge of Bed Supine to Sit: 2: Max assist;With rails;HOB elevated Sitting - Scoot to Edge of Bed: 3: Mod assist Details for Bed Mobility Assistance: Cues for technique, and encouragement to participate; Pt quite painful after MRI, including bil knees, required physical assist to move LEs off of bed, and physical assist to elevate trunk off of bed Transfers Transfers: Sit to Stand;Stand to Sit;Stand Pivot Transfers Sit to Stand: 2: Max assist;From bed;With upper extremity assist Stand to Sit: 2: Max assist;To chair/3-in-1;With upper extremity assist Stand Pivot Transfers: 2: Max assist Details for Transfer Assistance: Cues for technique and safety; pt still pulling up on RW  despite safety cues for hand placement; extremely flexed posture; Physical assist required for rise from bed; attempted to take steps to chair, but pt unable and we performed a pivot    Shoulder Instructions     Exercises     PT Diagnosis: Difficulty walking;Abnormality of gait;Generalized weakness;Acute pain  PT Problem List: Decreased strength;Decreased range of motion;Decreased activity tolerance;Decreased balance;Decreased mobility;Decreased coordination;Decreased knowledge of use of DME;Pain;Decreased knowledge of precautions PT Treatment Interventions: DME instruction;Gait training;Functional mobility training;Therapeutic activities;Therapeutic exercise;Balance training;Patient/family education   PT Goals Acute Rehab PT Goals PT Goal Formulation: With patient Time For Goal Achievement: 05/14/12 Potential to Achieve Goals: Fair Pt will Roll Supine to Right Side: with supervision PT Goal: Rolling Supine to Right Side - Progress: Goal set today Pt will Roll Supine to Left Side: with supervision PT Goal: Rolling Supine to Left Side - Progress: Goal set today Pt will go Supine/Side to Sit: with supervision PT Goal: Supine/Side to Sit - Progress: Goal set today Pt will go Sit to Supine/Side: with supervision PT Goal: Sit to Supine/Side - Progress: Goal set today Pt will go Sit to Stand: with min assist PT Goal: Sit to Stand - Progress: Goal set today Pt will go Stand to Sit: with min assist PT Goal: Stand to Sit - Progress: Goal set today Pt will Ambulate: 16 - 50 feet;with min assist;with rolling walker PT Goal: Ambulate - Progress: Goal set today  Visit Information  Last PT Received On: 04/30/12 Assistance Needed: +2    Subjective Data  Subjective: Agreeable to OOB for dinner; a lot on her mind with biopsy tomorrow   Prior Functioning  Home Living  Lives With: Alone Available Help at Discharge: Personal care attendant;Available PRN/intermittently (5 days for 5 hours) Type of  Home: Apartment Home Access: Level entry Home Layout: One level Bathroom Shower/Tub: Other (comment) (sponge bathes with assist) Home Adaptive Equipment: Walker - rolling;Bedside commode/3-in-1 Prior Function Level of Independence: Needs assistance Needs Assistance: Bathing;Dressing;Meal Prep;Light Housekeeping Bath: Moderate Dressing:  (stays in gowns at home) Meal Prep: Maximal Light Housekeeping: Maximal Comments: Reports has not been out of house since early Fall; Apparently her MD/RN makes house calls (?) Communication Communication: No difficulties    Cognition  Overall Cognitive Status: Appears within functional limits for tasks assessed/performed Arousal/Alertness: Awake/alert Orientation Level: Appears intact for tasks assessed Behavior During Session: [facility] for tasks performed    Extremity/Trunk Assessment Right Upper Extremity Assessment RUE ROM/Strength/Tone: Deficits RUE ROM/Strength/Tone Deficits: Generally  weak Left Upper Extremity Assessment LUE ROM/Strength/Tone: Deficits LUE ROM/Strength/Tone Deficits: Generally weak Right Lower Extremity Assessment RLE ROM/Strength/Tone: Deficits RLE ROM/Strength/Tone Deficits: Significant weakness; requiring mod to max physical assist for sit to stand; Audible crepitus bil knees Left Lower Extremity Assessment LLE ROM/Strength/Tone: Deficits LLE ROM/Strength/Tone Deficits: Significant weakness; requiring mod to max physical assist for sit to stand; Audible crepitus bil knees Trunk Assessment Trunk Assessment: Kyphotic   Balance    End of Session PT - End of Session Equipment Utilized During Treatment: Gait belt Activity Tolerance: Patient limited by pain Patient left: in chair;with call bell/phone within reach Nurse Communication: Mobility status  GP     Van Clines [facility] [place], [place] [phone]  04/30/2012, 5:55 PM

## 2012-04-30 NOTE — Progress Notes (Signed)
MRI of the LS Spine showing L2-L3 diskitis-have spoken with ID Dr Verline Lema off on antibiotics-will try to get CT guided biopsy before starting antibiotics.

## 2012-04-30 NOTE — Consult Note (Signed)
    [facility]     Reason for Consult: Discitis    Referring Physician: Dr. Jerral Ralph  Principal Problem:  *Acute back pain Active Problems:  Chronic diastolic heart failure  HTN (hypertension)  Obesity  DM type 2 (diabetes mellitus, type 2)  GERD (gastroesophageal reflux disease)  Hypothyroid      . acetaminophen  1,000 mg Oral TID  . amLODipine  5 mg Oral Daily   And  . irbesartan  150 mg Oral Daily  . enoxaparin (LOVENOX) injection  30 mg Subcutaneous Q24H  . fluticasone  2 spray Each Nare Daily  . furosemide  20 mg Oral BID  . levothyroxine  25 mcg Oral QAC breakfast  . metoprolol succinate  25 mg Oral BID  . pantoprazole  40 mg Oral Daily  . senna-docusate  1 tablet Oral Daily    Recommendations: Disc aspiration for Bacterial culture and gram stain, AFB and Fungal as well.  Hold antibiotics until biopsy has been done  Assessment: Discitis as cause of back pain.  No fever or chills so no acute indication for treatment.     Antibiotics: none  HPI: Jeanne Thomas is a 79 y.o. female with hypothyroid, HTN, heart failure who lives alone presented with worsening back pain over 2-3 days.  She has chronic pain but generally is mild compared to the recent pain.  She normally is mobile with a walker but had been unable to get up and her sister was unable to help.  She was therefore taken to the ED.  Initial xray was unremarkable but MRI done noted discitis.  She does have fusions and facet effusion and fluid signal intensitiy in intervertebral disc at L2-3.  No abscess.     Review of Systems: Pertinent items are noted in HPI.  Past Medical History  Diagnosis Date  . Hypertension   . GERD (gastroesophageal reflux disease)   . Hypothyroidism     History  Substance Use Topics  . Smoking status: Never Smoker   . Smokeless tobacco: Never Used  . Alcohol Use: No    History reviewed. No pertinent family history. No Known  Allergies  OBJECTIVE: Blood pressure 136/71, pulse 83, temperature 99.1 F (37.3 C), temperature source Oral, resp. rate 20, height 5' (1.524 m), weight 182 lb 12.2 oz (82.9 kg), SpO2 95.00%. General: Awake, alert, nad Lungs: CTA B Cor: RRR wihout m/r/g Back: no tenderness  Microbiology: No results found for this or any previous visit (from the past 240 hour(s)).  Staci Righter, MD [facility] for Infectious Disease [facility] [phone] pager  [phone] cell 04/30/2012, 2:45 PM

## 2012-04-30 NOTE — Progress Notes (Signed)
PATIENT DETAILS Name: Jeanne Thomas Age: 79 y.o. Sex: female Date of Birth: [birth year] Admit Date: 04/29/2012 Admitting Physician Hollice Espy, MD LKG:[id number], Sherilyn Cooter, MD  Subjective: Back pain essentially unchanged-?incontinent last night-now has a foley catheter  Assessment/Plan: Principal Problem:  *Acute back pain -Plain Xrays showing a lot of degenerative changes, and fusion of the vertebrae's -await PT eval -get MRI LS spine -try scheduled 1000mg  of tylenol TID to minimize narcotic use  Active Problems:  Chronic diastolic heart failure -clinically compensated -c/w Lasix, and metoprolol -Echo this admit-only if she decompensates   HTN (hypertension) -controlled -continue with Metoprolol, Amlodipine and Avapro  Hypothyroidism -c/w Levothyroxine  Possible Thyroid Goitre -assymptomatic currently -given age/frailty-no w/u needed for this-can pursue outpatient w/u if needed   Obesity -counseled regarding importance of weight loss   GERD (gastroesophageal reflux disease) -c/w PPI  Disposition: Remain inpatient-possible d/c depending on MRI and PT eval. Lives alone.  DVT Prophylaxis: Prophylactic Lovenox   Code Status: Full code   Procedures:  None  CONSULTS:  None  PHYSICAL EXAM: Vital signs in last 24 hours: Filed Vitals:   04/29/12 1800 04/29/12 1900 04/29/12 2055 04/30/12 0602  BP: 149/61 158/76 132/68 128/62  Pulse: 91 92 85 74  Temp:  99 F (37.2 C) 98.1 F (36.7 C) 99.1 F (37.3 C)  TempSrc:  Oral Oral Oral  Resp: 21 20 20 20   Height:  5' (1.524 m)    Weight:  82.9 kg (182 lb 12.2 oz)    SpO2: 96% 97% 99% 95%    Weight change:  Body mass index is 35.69 kg/(m^2).   Gen Exam: Awake and alert with clear speech.   Neck: Supple, No JVD.   Chest: B/L Clear.   CVS: S1 S2 Regular, no murmurs.  Abdomen: soft, BS +, non tender, non distended.  Extremities: no edema, lower extremities warm to touch. Neurologic: Non Focal. -but has gen  weakness in b/l lower ext-4/5  Skin: No Rash.  Wounds: N/A.    Intake/Output from previous day:  Intake/Output Summary (Last 24 hours) at 04/30/12 0928 Last data filed at 04/30/12 0900  Gross per 24 hour  Intake    280 ml  Output    700 ml  Net   -420 ml     LAB RESULTS: CBC  Lab 04/29/12 2144 04/29/12 1337  WBC 8.3 9.3  HGB 12.2 13.0  HCT 37.7 39.2  PLT 179 188  MCV 92.2 94.0  MCH 29.8 31.2  MCHC 32.4 33.2  RDW 14.5 14.8  LYMPHSABS -- 1.5  MONOABS -- 1.2*  EOSABS -- 0.1  BASOSABS -- 0.1  BANDABS -- --    Chemistries   Lab 04/29/12 2144 04/29/12 1337  NA -- 133*  K -- 4.2  CL -- 94*  CO2 -- 27  GLUCOSE -- 121*  BUN -- 15  CREATININE 0.51 0.58  CALCIUM -- 10.1  MG -- --    CBG:  Lab 04/30/12 0744  GLUCAP 116*    GFR Estimated Creatinine Clearance: 42.9 ml/min (by C-G formula based on Cr of 0.51).  Coagulation profile No results found for this basename: INR:5,PROTIME:5 in the last 168 hours  Cardiac Enzymes No results found for this basename: CK:3,CKMB:3,TROPONINI:3,MYOGLOBIN:3 in the last 168 hours  No components found with this basename: POCBNP:3 No results found for this basename: DDIMER:2 in the last 72 hours No results found for this basename: HGBA1C:2 in the last 72 hours No results found for this basename: CHOL:2,HDL:2,LDLCALC:2,TRIG:2,CHOLHDL:2,LDLDIRECT:2 in the  last 72 hours No results found for this basename: TSH,T4TOTAL,FREET3,T3FREE,THYROIDAB in the last 72 hours No results found for this basename: VITAMINB12:2,FOLATE:2,FERRITIN:2,TIBC:2,IRON:2,RETICCTPCT:2 in the last 72 hours No results found for this basename: LIPASE:2,AMYLASE:2 in the last 72 hours  Urine Studies No results found for this basename: UACOL:2,UAPR:2,USPG:2,UPH:2,UTP:2,UGL:2,UKET:2,UBIL:2,UHGB:2,UNIT:2,UROB:2,ULEU:2,UEPI:2,UWBC:2,URBC:2,UBAC:2,CAST:2,CRYS:2,UCOM:2,BILUA:2 in the last 72 hours  MICROBIOLOGY: No results found for this or any previous visit (from the  past 240 hour(s)).  RADIOLOGY STUDIES/RESULTS: Dg Chest 2 View  04/29/2012  *RADIOLOGY REPORT*  Clinical Data: Cough.  Chest pain.  Urinary tract infection. Current history of hypertension and diabetes.  CHEST - 2 VIEW  Comparison: Two-view chest x-ray 03/17/2012, 02/12/2012.  Findings: Suboptimal inspiration due to body habitus which accounts for crowded bronchovascular markings at the bases and accentuates the cardiac silhouette.  Taking this into account, cardiac silhouette mildly enlarged but stable.  Thoracic aorta mildly tortuous and atherosclerotic, unchanged.  Hilar and mediastinal contours otherwise unremarkable.  Lungs clear.  Bronchovascular markings normal.  Pulmonary vascularity normal.  No pneumothorax. No pleural effusions.  Degenerative changes throughout the thoracic spine.  Severe degenerative changes involving both shoulders as noted previously.  IMPRESSION: Suboptimal inspiration.  Stable mild cardiomegaly.  No acute cardiopulmonary disease.   Original Report Authenticated By: Hulan Saas, M.D.    Dg Thoracolumabar Spine  04/29/2012  *RADIOLOGY REPORT*  Clinical Data: Back pain.  THORACOLUMBAR SPINE - 2 VIEW  Comparison: Chest radiographs obtained earlier today and previously.  Findings: Partially included a superior mediastinal mass, described on the chest radiographs dated 02/12/2012.  Enlarged heart. Extensive thoracolumbar spine degenerative changes and mild scoliosis.  The L3-L5 vertebrae appear fused with poorly defined margins.  The T11 and T12 vertebra also appear fused, with poorly defined margins.  IMPRESSION:  1.  Apparent vertebral fusion at multiple levels with poorly defined margins, as described above. 2.  Extensive thoracolumbar spine degenerative changes and mild scoliosis. 3.  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  This can be better determined with elective thyroid ultrasound or chest CT with contrast.   Original Report Authenticated  By: Beckie Salts, M.D.     MEDICATIONS: Scheduled Meds:   . amLODipine  5 mg Oral Daily   And  . irbesartan  150 mg Oral Daily  . enoxaparin (LOVENOX) injection  30 mg Subcutaneous Q24H  . fluticasone  2 spray Each Nare Daily  . furosemide  20 mg Oral BID  . levothyroxine  25 mcg Oral QAC breakfast  . metoprolol succinate  25 mg Oral BID  . pantoprazole  40 mg Oral Daily  . senna-docusate  1 tablet Oral Daily   Continuous Infusions:  PRN Meds:.acetaminophen, acetaminophen, ondansetron (ZOFRAN) IV, ondansetron, oxyCODONE, traMADol  Antibiotics: Anti-infectives    None       Jeoffrey Massed, MD  [facility] Pager:[phone]  If 7PM-7AM, please contact night-coverage [email or web address] Password [email or web address] 04/30/2012, 9:28 AM   LOS: 1 day

## 2012-05-01 ENCOUNTER — Inpatient Hospital Stay (HOSPITAL_COMMUNITY): Payer: Medicare Other

## 2012-05-01 ENCOUNTER — Encounter (HOSPITAL_COMMUNITY): Payer: Self-pay | Admitting: Radiology

## 2012-05-01 DIAGNOSIS — E039 Hypothyroidism, unspecified: Secondary | ICD-10-CM

## 2012-05-01 DIAGNOSIS — R5381 Other malaise: Secondary | ICD-10-CM

## 2012-05-01 LAB — CBC
HCT: 35.6 % — ABNORMAL LOW (ref 36.0–46.0)
Hemoglobin: 11.8 g/dL — ABNORMAL LOW (ref 12.0–15.0)
RBC: 3.84 MIL/uL — ABNORMAL LOW (ref 3.87–5.11)
RDW: 14.9 % (ref 11.5–15.5)
WBC: 8.3 10*3/uL (ref 4.0–10.5)

## 2012-05-01 LAB — PROTIME-INR: INR: 1.34 (ref 0.00–1.49)

## 2012-05-01 MED ORDER — VANCOMYCIN HCL 1000 MG IV SOLR
750.0000 mg | Freq: Two times a day (BID) | INTRAVENOUS | Status: DC
  Administered 2012-05-01 – 2012-05-03 (×4): 750 mg via INTRAVENOUS
  Filled 2012-05-01 (×6): qty 750

## 2012-05-01 MED ORDER — POLYETHYLENE GLYCOL 3350 17 G PO PACK
17.0000 g | PACK | Freq: Two times a day (BID) | ORAL | Status: DC
  Administered 2012-05-01 – 2012-05-03 (×4): 17 g via ORAL
  Filled 2012-05-01 (×6): qty 1

## 2012-05-01 MED ORDER — MIDAZOLAM HCL 2 MG/2ML IJ SOLN
INTRAMUSCULAR | Status: AC | PRN
  Administered 2012-05-01: 1 mg via INTRAVENOUS

## 2012-05-01 MED ORDER — FENTANYL CITRATE 0.05 MG/ML IJ SOLN
INTRAMUSCULAR | Status: AC | PRN
  Administered 2012-05-01: 50 ug via INTRAVENOUS

## 2012-05-01 MED ORDER — DEXTROSE 5 % IV SOLN
2.0000 g | INTRAVENOUS | Status: DC
  Administered 2012-05-01 – 2012-05-02 (×2): 2 g via INTRAVENOUS
  Filled 2012-05-01 (×3): qty 2

## 2012-05-01 MED ORDER — HYDROCODONE-ACETAMINOPHEN 5-325 MG PO TABS: 1.0000 | ORAL_TABLET | ORAL | Status: DC | PRN

## 2012-05-01 MED ORDER — FLEET ENEMA 7-19 GM/118ML RE ENEM: 1.0000 | ENEMA | Freq: Every day | RECTAL | Status: DC | PRN

## 2012-05-01 NOTE — Procedures (Signed)
Fluoro guided aspiration L2-3 2ml clear yellow fluid, for GS, C&S No complication No blood loss. See complete dictation in [facility].

## 2012-05-01 NOTE — Consult Note (Signed)
Physical Medicine and Rehabilitation Consult Reason for Consult: Discitis Referring Physician: [facility]   HPI: Jeanne Thomas is a 79 y.o. right-handed female with history of hypertension, diastolic congestive heart failure. Admitted 04/29/2012 with low back pain that progressed over the past 2-3 days. Patient lives alone and was ambulatory with a walker prior to admission but sedentary. She has a home health aide Monday through Saturday 5 hours a day and is alone at night time. There was no reports of fall. MRI thoracic lumbar spine showed suspicion for discitis at the lumbar L2-3 level with abnormal fluid signal intensity of the disc space as well as moderate impingement at lumbar L2-3, 3-4 and L5-S1. Infectious disease consulted with plan for disc aspiration for further workup. Placed on subcutaneous Lovenox for DVT prophylaxis. Physical therapy evaluation completed an ongoing with recommendations for physical medicine rehabilitation consult to consider inpatient rehabilitation services   Review of Systems  Cardiovascular: Positive for leg swelling.  Gastrointestinal: Positive for constipation.       Reflux  Musculoskeletal: Positive for myalgias and back pain.  All other systems reviewed and are negative.   Past Medical History  Diagnosis Date  . Hypertension   . GERD (gastroesophageal reflux disease)   . Hypothyroidism    History reviewed. No pertinent past surgical history. History reviewed. No pertinent family history. Social History:  reports that she has never smoked. She has never used smokeless tobacco. She reports that she does not drink alcohol or use illicit drugs. Allergies: No Known Allergies Medications Prior to Admission  Medication Sig Dispense Refill  . amLODipine-olmesartan (AZOR) 5-20 MG per tablet Take 1 tablet by mouth daily.      . Cholecalciferol (VITAMIN D3) 2000 UNITS TABS Take 2,000 mg by mouth daily.      . fluticasone (FLONASE) 50 MCG/ACT nasal spray Place  2 sprays into the nose daily.  16 g  1  . furosemide (LASIX) 20 MG tablet Take 20 mg by mouth 2 (two) times daily.      Marland Kitchen levothyroxine (SYNTHROID, LEVOTHROID) 25 MCG tablet Take 25 mcg by mouth daily.      . metoprolol succinate (TOPROL-XL) 25 MG 24 hr tablet Take 25 mg by mouth 2 (two) times daily.      Marland Kitchen omeprazole (PRILOSEC) 20 MG capsule Take 40 mg by mouth daily.      . potassium chloride SA (K-DUR,KLOR-CON) 20 MEQ tablet Take 20 mEq by mouth daily.      Marland Kitchen senna-docusate (SENOKOT-S) 8.6-50 MG per tablet Take 1 tablet by mouth daily.      . traMADol (ULTRAM) 50 MG tablet Take 50 mg by mouth every 12 (twelve) hours as needed. pain        Home: Home Living Lives With: Alone Available Help at Discharge: Personal care attendant;Available PRN/intermittently (5 days for 5 hours) Type of Home: Apartment Home Access: Level entry Home Layout: One level Bathroom Shower/Tub: Other (comment) (sponge bathes with assist) Home Adaptive Equipment: Walker - rolling;Bedside commode/3-in-1  Functional History: Prior Function Bath: Moderate Dressing:  (stays in gowns at home) Meal Prep: Maximal Light Housekeeping: Maximal Comments: Reports has not been out of house since early Fall; Apparently her MD/RN makes house calls (?) Functional Status:  Mobility: Bed Mobility Bed Mobility: Supine to Sit;Sitting - Scoot to Edge of Bed Supine to Sit: 2: Max assist;With rails;HOB elevated Sitting - Scoot to [employer] of Bed: 3: Mod assist Transfers Transfers: Sit to Stand;Stand to [employer] Transfers Sit to Stand: 2:  Max assist;From bed;With upper extremity assist Stand to Sit: 2: Max assist;To chair/3-in-1;With upper extremity assist Stand Pivot Transfers: 2: Max assist      ADL:    Cognition: Cognition Arousal/Alertness: Awake/alert Cognition Overall Cognitive Status: Appears within functional limits for tasks assessed/performed Arousal/Alertness: Awake/alert Orientation Level: Appears  intact for tasks assessed Behavior During Session: [facility] for tasks performed  Blood pressure 125/70, pulse 71, temperature 97.6 F (36.4 C), temperature source Oral, resp. rate 20, height 5' (1.524 m), weight 82.9 kg (182 lb 12.2 oz), SpO2 98.00%. Physical Exam  Vitals reviewed. Constitutional: She is oriented to person, place, and time.  HENT:  Head: Normocephalic.  Eyes:       Pupils round and reactive to light  Neck: Neck supple. No thyromegaly present.  Cardiovascular: Normal rate and regular rhythm.   Pulmonary/Chest: Breath sounds normal. No respiratory distress.  Abdominal: Soft. Bowel sounds are normal. There is no tenderness.  Musculoskeletal:       +1 edema lower extremities  Neurological: She is alert and oriented to person, place, and time.       Follows three-step commands  Skin: Skin is warm and dry.  Psychiatric: She has a normal mood and affect.    Results for orders placed during the hospital encounter of 04/29/12 (from the past 24 hour(s))  SEDIMENTATION RATE     Status: Abnormal   Collection Time   04/30/12  2:38 PM      Component Value Range   Sed Rate 38 (*) 0 - 22 mm/hr  C-REACTIVE PROTEIN     Status: Abnormal   Collection Time   04/30/12  2:38 PM      Component Value Range   CRP 12.9 (*) <0.60 mg/dL  CULTURE, BLOOD (ROUTINE X 2)     Status: Normal (Preliminary result)   Collection Time   04/30/12  3:05 PM      Component Value Range   Specimen Description BLOOD RIGHT HAND     Special Requests BOTTLES DRAWN AEROBIC AND ANAEROBIC 10CC     Culture  Setup Time 04/30/2012 21:11     Culture       Value:        BLOOD CULTURE RECEIVED NO GROWTH TO DATE CULTURE WILL BE HELD FOR 5 DAYS BEFORE ISSUING A FINAL NEGATIVE REPORT   Report Status PENDING    CULTURE, BLOOD (ROUTINE X 2)     Status: Normal (Preliminary result)   Collection Time   04/30/12  3:06 PM      Component Value Range   Specimen Description BLOOD LEFT HAND     Special Requests BOTTLES DRAWN AEROBIC  AND ANAEROBIC 10CC     Culture  Setup Time 04/30/2012 21:11     Culture       Value:        BLOOD CULTURE RECEIVED NO GROWTH TO DATE CULTURE WILL BE HELD FOR 5 DAYS BEFORE ISSUING A FINAL NEGATIVE REPORT   Report Status PENDING    PROTIME-INR     Status: Abnormal   Collection Time   05/01/12  6:20 AM      Component Value Range   Prothrombin Time 16.3 (*) 11.6 - 15.2 seconds   INR 1.34  0.00 - 1.49  CBC     Status: Abnormal   Collection Time   05/01/12  6:20 AM      Component Value Range   WBC 8.3  4.0 - 10.5 K/uL   RBC 3.84 (*) 3.87 - 5.11  MIL/uL   Hemoglobin 11.8 (*) 12.0 - 15.0 g/dL   HCT 45.4 (*) 09.8 - 11.9 %   MCV 92.7  78.0 - 100.0 fL   MCH 30.7  26.0 - 34.0 pg   MCHC 33.1  30.0 - 36.0 g/dL   RDW 14.7  82.9 - 56.2 %   Platelets 178  150 - 400 K/uL  GLUCOSE, CAPILLARY     Status: Abnormal   Collection Time   05/01/12  7:54 AM      Component Value Range   Glucose-Capillary 100 (*) 70 - 99 mg/dL   Dg Chest 2 View  04/29/863  *RADIOLOGY REPORT*  Clinical Data: Cough.  Chest pain.  Urinary tract infection. Current history of hypertension and diabetes.  CHEST - 2 VIEW  Comparison: Two-view chest x-ray 03/17/2012, 02/12/2012.  Findings: Suboptimal inspiration due to body habitus which accounts for crowded bronchovascular markings at the bases and accentuates the cardiac silhouette.  Taking this into account, cardiac silhouette mildly enlarged but stable.  Thoracic aorta mildly tortuous and atherosclerotic, unchanged.  Hilar and mediastinal contours otherwise unremarkable.  Lungs clear.  Bronchovascular markings normal.  Pulmonary vascularity normal.  No pneumothorax. No pleural effusions.  Degenerative changes throughout the thoracic spine.  Severe degenerative changes involving both shoulders as noted previously.  IMPRESSION: Suboptimal inspiration.  Stable mild cardiomegaly.  No acute cardiopulmonary disease.   Original Report Authenticated By: Hulan Saas, M.D.    Dg Thoracolumabar  Spine  04/29/2012  *RADIOLOGY REPORT*  Clinical Data: Back pain.  THORACOLUMBAR SPINE - 2 VIEW  Comparison: Chest radiographs obtained earlier today and previously.  Findings: Partially included a superior mediastinal mass, described on the chest radiographs dated 02/12/2012.  Enlarged heart. Extensive thoracolumbar spine degenerative changes and mild scoliosis.  The L3-L5 vertebrae appear fused with poorly defined margins.  The T11 and T12 vertebra also appear fused, with poorly defined margins.  IMPRESSION:  1.  Apparent vertebral fusion at multiple levels with poorly defined margins, as described above. 2.  Extensive thoracolumbar spine degenerative changes and mild scoliosis. 3.  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  This can be better determined with elective thyroid ultrasound or chest CT with contrast.   Original Report Authenticated By: Beckie Salts, M.D.    Mr Lumbar Spine W Wo Contrast  04/30/2012  *RADIOLOGY REPORT*  Clinical Data: Low back pain.  MRI LUMBAR SPINE WITHOUT AND WITH CONTRAST  Technique:  Multiplanar and multiecho pulse sequences of the lumbar spine were obtained without and with intravenous contrast.  Contrast:  17 ml Multihance  Comparison: None.  Findings: The lowest full intervertebral disk space is labeled L5- S1.  If procedural intervention is to be performed, careful correlation with this numbering strategy is recommended.  There is a prominent degree of motion artifact.  Perhaps in order to compensate for this, the technologist chose a very large field of view.  This allows [place] to see all the way up to the T6 level, but has the unfortunate side effect of the a dramatically reduced spatial resolution.  Today's exam has reduced sensitivity and specificity for evaluation of the lumbar spine due to these factors.  Moreover, there is signal loss in the thoracic region due to coil positioning, and accordingly this is not considered a highly reliable  assessment of the lower half of the thoracic spine.  There is prominent dextroconvex lumbar scoliosis with rotary component.  Interbody fusion noted at the L3-L4-L5, also with solid interbody fusion at T11-12  and possible interbody fusion at T10-11. Facet effusion is suspected at the L3 - L4-L5.  There is fluid signal intensity throughout the intervertebral disc at L2-3, with abnormal endplate edema and low-level endplate enhancement. On the left side there is paraspinal edema and enhancement without obvious abscess.  The right side of the intervertebral disc does not demonstrate this fluid signal intensity.  There is also a small left facet effusion at the L2-3 level.  The conus medullaris appears unremarkable.  Conus level:  L1.  There is 3 mm of degenerative posterior subluxation at T12-L1 and L1-2.  There is a suggestion of posterior osseous ridging at T7-8 and T9- 10. Additional findings at individual levels are as follows:  T10-11:  Suspected mild right foraminal stenosis due to uncinate and facet spurring.  Posterior osseous ridging.  T11-12:  Suspected mild to moderate right foraminal stenosis and mild central stenosis due to posterior osseous ridging and facet arthropathy.  T12-L1:  Borderline right foraminal stenosis and borderline central stenosis due to disc osteophyte complex.  L1-2:  Mild left foraminal stenosis due to facet arthropathy and disc osteophyte complex.  L2-3:  Aside from the findings noted above, there is moderate left foraminal stenosis and mild left subarticular lateral recess stenosis in addition to borderline central stenosis secondary to facet arthropathy, ligament flavum redundancy, and disc osteophyte complex.  L3-4:  Moderate central stenosis with mild left subarticular lateral recess stenosis and borderline left foraminal stenosis due to posterior osseous ridging and facet arthropathy.  L4-5:  No impingement.  L5 S1:  Moderate right and mild left foraminal stenosis along with mild  right subarticular lateral recess stenosis secondary to disc osteophyte complex and facet arthropathy.  IMPRESSION:  1.  Suspicion for diskitis - osteomyelitis at the L2-3 level, with abnormal fluid signal intensity of the disc space, endplate edema and enhancement, and mild left paravertebral edema and enhancement. I do not observe findings characteristic of an epidural abscess currently. 2.  Prominent dextroconvex lumbar scoliosis with considerable thoracolumbar spondylosis and degenerative disc disease, resulting in moderate impingement at L2-3, L3-4, and L5-S1, as well as mild impingement at L1-2, T10-11, and T11-12. 2.  Reduced sensitivity due to motion artifact and large field of view.   Original Report Authenticated By: Gaylyn Rong, M.D.     Assessment/Plan: 79yo female with lumbar diskitis, spondylosis. She has needed assistance at home for some time now. Don't feel she would tolerate or progress on inpatient rehab enough to return home. Recommend SNF.  Ivory Broad, MD  05/01/2012

## 2012-05-01 NOTE — Progress Notes (Signed)
Utilization review complete 

## 2012-05-01 NOTE — Progress Notes (Signed)
NCM spoke to pt and states she lives alone. She has a sister, Monika Salk [phone] that can assist her at home. States she has [facility] CAP program with the aide coming Mon-Sat for five hours. She sleeps in her recliner at home. Had hospital bed in the past but could not sleep on hard mattress. Has RW and 3n1 at home. Pt is agreeable to IP rehab if she qualifies. Will verify with [facility] CAP program that she can have aide when IP rehab complete. Isidoro Donning RN CCM Case Mgmt phone [phone]

## 2012-05-01 NOTE — Progress Notes (Signed)
Rehab Admissions Coordinator Note:  Patient was screened by Trish Mage for appropriateness for an Inpatient Acute Rehab Consult.  Noted that PT is recommending CIR.   At this time, we are recommending Inpatient Rehab consult.  Trish Mage 05/01/2012, 8:59 AM  I can be reached at [phone].

## 2012-05-01 NOTE — Progress Notes (Signed)
  Echocardiogram 2D Echocardiogram has been performed.  Jeanne Thomas FRANCES 05/01/2012, 5:57 PM

## 2012-05-01 NOTE — Progress Notes (Signed)
05/01/12 2055 IV team, PICC placement - In to speak with patient regarding PICC placement. Pt verbalizes understanding of procedure and agrees that she will need it. However patient wishes to "Think about it" and states she would rather wait until 05/02/12 AM to have line placed. IV team will follow up in AM.

## 2012-05-01 NOTE — Progress Notes (Signed)
NCM spoke to pt and states she wants to go home with [facility] PT. Explained CSW will speak to her about the different facilities available. Pt states she has the home health aide that comes Mon-Sat. Will follow up with family to see who will assist pt once aide has left.  Isidoro Donning RN CCM Case Mgmt phone [phone]

## 2012-05-01 NOTE — Progress Notes (Signed)
INFECTIOUS DISEASE PROGRESS NOTE  ID: Jeanne Thomas is a 79 y.o. female with  Principal Problem:  *Acute back pain Active Problems:  Chronic diastolic heart failure  HTN (hypertension)  Obesity  DM type 2 (diabetes mellitus, type 2)  GERD (gastroesophageal reflux disease)  Hypothyroid  Subjective: Without complaints  Abtx:  Anti-infectives    None      Medications:  Scheduled:   . acetaminophen  1,000 mg Oral TID  . amLODipine  5 mg Oral Daily   And  . irbesartan  150 mg Oral Daily  . enoxaparin (LOVENOX) injection  30 mg Subcutaneous Q24H  . fluticasone  2 spray Each Nare Daily  . furosemide  20 mg Oral BID  . levothyroxine  25 mcg Oral QAC breakfast  . metoprolol succinate  25 mg Oral BID  . pantoprazole  40 mg Oral Daily  . polyethylene glycol  17 g Oral BID  . senna-docusate  1 tablet Oral Daily    Objective: Vital signs in last 24 hours: Temp:  [97.6 F (36.4 C)-98.9 F (37.2 C)] 98.5 F (36.9 C) (01/06 1300) Pulse Rate:  [62-105] 76  (01/06 1300) Resp:  [14-20] 18  (01/06 1300) BP: (114-174)/(65-102) 115/66 mmHg (01/06 1300) SpO2:  [93 %-100 %] 99 % (01/06 1300)   General appearance: alert, cooperative and no distress Resp: clear to auscultation bilaterally Cardio: regular rate and rhythm GI: normal findings: bowel sounds normal and soft, non-tender  Lab Results  Basename 05/01/12 0620 04/29/12 2144 04/29/12 1337  WBC 8.3 8.3 --  HGB 11.8* 12.2 --  HCT 35.6* 37.7 --  NA -- -- 133*  K -- -- 4.2  CL -- -- 94*  CO2 -- -- 27  BUN -- -- 15  CREATININE -- 0.51 0.58  GLU -- -- --   Liver Panel  Basename 04/29/12 1337  PROT 8.3  ALBUMIN 3.9  AST 19  ALT 8  ALKPHOS 70  BILITOT 0.8  BILIDIR --  IBILI --   Sedimentation Rate  Basename 04/30/12 1438  ESRSEDRATE 38*   C-Reactive Protein  Basename 04/30/12 1438  CRP 12.9*    Microbiology: Recent Results (from the past 240 hour(s))  CULTURE, BLOOD (ROUTINE X 2)     Status:  Normal (Preliminary result)   Collection Time   04/30/12  3:05 PM      Component Value Range Status Comment   Specimen Description BLOOD RIGHT HAND   Final    Special Requests BOTTLES DRAWN AEROBIC AND ANAEROBIC 10CC   Final    Culture  Setup Time 04/30/2012 21:11   Final    Culture     Final    Value:        BLOOD CULTURE RECEIVED NO GROWTH TO DATE CULTURE WILL BE HELD FOR 5 DAYS BEFORE ISSUING A FINAL NEGATIVE REPORT   Report Status PENDING   Incomplete   CULTURE, BLOOD (ROUTINE X 2)     Status: Normal (Preliminary result)   Collection Time   04/30/12  3:06 PM      Component Value Range Status Comment   Specimen Description BLOOD LEFT HAND   Final    Special Requests BOTTLES DRAWN AEROBIC AND ANAEROBIC 10CC   Final    Culture  Setup Time 04/30/2012 21:11   Final    Culture     Final    Value:        BLOOD CULTURE RECEIVED NO GROWTH TO DATE CULTURE WILL BE HELD FOR 5  DAYS BEFORE ISSUING A FINAL NEGATIVE REPORT   Report Status PENDING   Incomplete     Studies/Results: Dg Thoracolumabar Spine  04/29/2012  *RADIOLOGY REPORT*  Clinical Data: Back pain.  THORACOLUMBAR SPINE - 2 VIEW  Comparison: Chest radiographs obtained earlier today and previously.  Findings: Partially included a superior mediastinal mass, described on the chest radiographs dated 02/12/2012.  Enlarged heart. Extensive thoracolumbar spine degenerative changes and mild scoliosis.  The L3-L5 vertebrae appear fused with poorly defined margins.  The T11 and T12 vertebra also appear fused, with poorly defined margins.  IMPRESSION:  1.  Apparent vertebral fusion at multiple levels with poorly defined margins, as described above. 2.  Extensive thoracolumbar spine degenerative changes and mild scoliosis. 3.  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  This can be better determined with elective thyroid ultrasound or chest CT with contrast.   Original Report Authenticated By: Beckie Salts, M.D.    Mr Lumbar  Spine W Wo Contrast  04/30/2012  *RADIOLOGY REPORT*  Clinical Data: Low back pain.  MRI LUMBAR SPINE WITHOUT AND WITH CONTRAST  Technique:  Multiplanar and multiecho pulse sequences of the lumbar spine were obtained without and with intravenous contrast.  Contrast:  17 ml Multihance  Comparison: None.  Findings: The lowest full intervertebral disk space is labeled L5- S1.  If procedural intervention is to be performed, careful correlation with this numbering strategy is recommended.  There is a prominent degree of motion artifact.  Perhaps in order to compensate for this, the technologist chose a very large field of view.  This allows [place] to see all the way up to the T6 level, but has the unfortunate side effect of the a dramatically reduced spatial resolution.  Today's exam has reduced sensitivity and specificity for evaluation of the lumbar spine due to these factors.  Moreover, there is signal loss in the thoracic region due to coil positioning, and accordingly this is not considered a highly reliable assessment of the lower half of the thoracic spine.  There is prominent dextroconvex lumbar scoliosis with rotary component.  Interbody fusion noted at the L3-L4-L5, also with solid interbody fusion at T11-12 and possible interbody fusion at T10-11. Facet effusion is suspected at the L3 - L4-L5.  There is fluid signal intensity throughout the intervertebral disc at L2-3, with abnormal endplate edema and low-level endplate enhancement. On the left side there is paraspinal edema and enhancement without obvious abscess.  The right side of the intervertebral disc does not demonstrate this fluid signal intensity.  There is also a small left facet effusion at the L2-3 level.  The conus medullaris appears unremarkable.  Conus level:  L1.  There is 3 mm of degenerative posterior subluxation at T12-L1 and L1-2.  There is a suggestion of posterior osseous ridging at T7-8 and T9- 10. Additional findings at individual levels are  as follows:  T10-11:  Suspected mild right foraminal stenosis due to uncinate and facet spurring.  Posterior osseous ridging.  T11-12:  Suspected mild to moderate right foraminal stenosis and mild central stenosis due to posterior osseous ridging and facet arthropathy.  T12-L1:  Borderline right foraminal stenosis and borderline central stenosis due to disc osteophyte complex.  L1-2:  Mild left foraminal stenosis due to facet arthropathy and disc osteophyte complex.  L2-3:  Aside from the findings noted above, there is moderate left foraminal stenosis and mild left subarticular lateral recess stenosis in addition to borderline central stenosis secondary to facet arthropathy, ligament flavum  redundancy, and disc osteophyte complex.  L3-4:  Moderate central stenosis with mild left subarticular lateral recess stenosis and borderline left foraminal stenosis due to posterior osseous ridging and facet arthropathy.  L4-5:  No impingement.  L5 S1:  Moderate right and mild left foraminal stenosis along with mild right subarticular lateral recess stenosis secondary to disc osteophyte complex and facet arthropathy.  IMPRESSION:  1.  Suspicion for diskitis - osteomyelitis at the L2-3 level, with abnormal fluid signal intensity of the disc space, endplate edema and enhancement, and mild left paravertebral edema and enhancement. I do not observe findings characteristic of an epidural abscess currently. 2.  Prominent dextroconvex lumbar scoliosis with considerable thoracolumbar spondylosis and degenerative disc disease, resulting in moderate impingement at L2-3, L3-4, and L5-S1, as well as mild impingement at L1-2, T10-11, and T11-12. 2.  Reduced sensitivity due to motion artifact and large field of view.   Original Report Authenticated By: Gaylyn Rong, M.D.    Ir Fl Guide Spinal/si Jt Inj Left  05/01/2012  *RADIOLOGY REPORT*  Clinical data:  Worsening low back pain.  MR demonstrates changes suggestive of  diskitis/osteomyelitis L2-3.  LUMBAR DISC ASPIRATION UNDER FLUOROSCOPY  Technique and findings: The procedure, risks (including but not limited to bleeding, infection, organ damage), benefits, and alternatives were explained to the patient.  Questions regarding the procedure were encouraged and answered.  The patient understands and consents to the procedure.The patient was placed prone on the procedure table and the lumbar region prepped and draped usual sterile fashion. Maximal barrier sterile technique was utilized including caps, mask, sterile gowns, sterile gloves, sterile drape, hand hygiene and skin antiseptic.  Intravenous Fentanyl and Versed were administered as conscious sedation during continuous cardiorespiratory monitoring by the radiology RN, with a total moderate sedation time of seven minutes.  Under fluoroscopic guidance, a 16 gauge trocar needle was advanced into the L2-3 interspace from a right posterolateral approach. Needle tip position was confirmed within the interspace on AP and lateral fluoroscopy.  Approximate 1 ml of clear yellow fluid could be aspirated, sent for Gram stain, culture and sensitivity. The patient tolerated the procedure well.  No immediate complication.  IMPRESSION: Technically successful L2-3 disc aspiration under fluoroscopy.   Original Report Authenticated By: D. Andria Rhein, MD      Assessment/Plan: Discitis/Ostoemyelitis L2-3 DM2 Total days of antibiotics 0 Will start vanco/ceftriaxone Place PIC Await studies from aspirate (routine/afb/fungal)        Johny Sax Infectious Diseases [phone] 05/01/2012, 4:03 PM   LOS: 2 days

## 2012-05-01 NOTE — Progress Notes (Signed)
Patient complaining of constipation.  Stating she uses fleet enemas and miralax at home for constipation.  Please address.  Thanks.

## 2012-05-01 NOTE — H&P (Signed)
HPI: Jeanne Thomas is an 79 y.o. female with back pain and found to have discitis at the L2-L3 level by MRI. Primary team and ID have requested disc aspiration bx/cx for further workup. PMHx and meds reviewed.  Past Medical History:  Past Medical History  Diagnosis Date  . Hypertension   . GERD (gastroesophageal reflux disease)   . Hypothyroidism     Past Surgical History: History reviewed. No pertinent past surgical history.  Family History: History reviewed. No pertinent family history.  Social History:  reports that she has never smoked. She has never used smokeless tobacco. She reports that she does not drink alcohol or use illicit drugs.  Allergies: No Known Allergies  Medications: Medications Prior to Admission  Medication Sig Dispense Refill  . amLODipine-olmesartan (AZOR) 5-20 MG per tablet Take 1 tablet by mouth daily.      . Cholecalciferol (VITAMIN D3) 2000 UNITS TABS Take 2,000 mg by mouth daily.      . fluticasone (FLONASE) 50 MCG/ACT nasal spray Place 2 sprays into the nose daily.  16 g  1  . furosemide (LASIX) 20 MG tablet Take 20 mg by mouth 2 (two) times daily.      Marland Kitchen levothyroxine (SYNTHROID, LEVOTHROID) 25 MCG tablet Take 25 mcg by mouth daily.      . metoprolol succinate (TOPROL-XL) 25 MG 24 hr tablet Take 25 mg by mouth 2 (two) times daily.      Marland Kitchen omeprazole (PRILOSEC) 20 MG capsule Take 40 mg by mouth daily.      . potassium chloride SA (K-DUR,KLOR-CON) 20 MEQ tablet Take 20 mEq by mouth daily.      Marland Kitchen senna-docusate (SENOKOT-S) 8.6-50 MG per tablet Take 1 tablet by mouth daily.      . traMADol (ULTRAM) 50 MG tablet Take 50 mg by mouth every 12 (twelve) hours as needed. pain        Please HPI for pertinent positives, otherwise complete 10 system ROS negative.  Physical Exam: Blood pressure 125/70, pulse 71, temperature 97.6 F (36.4 C), temperature source Oral, resp. rate 20, height 5' (1.524 m), weight 182 lb 12.2 oz (82.9 kg), SpO2 98.00%. Body mass  index is 35.69 kg/(m^2).   General Appearance:  Alert, cooperative, no distress, appears stated age  Head:  Normocephalic, without obvious abnormality, atraumatic  ENT: Unremarkable  Neck: Supple, symmetrical, trachea midline, no adenopathy, thyroid: not enlarged, symmetric, no tenderness/mass/nodules  Lungs:   Clear to auscultation bilaterally, no w/r/r, respirations unlabored without use of accessory muscles.  Chest Wall:  No tenderness or deformity  Heart:  Regular rate and rhythm, S1, S2 normal, no murmur, rub or gallop. Carotids 2+ without bruit.  Neurologic: Normal affect, no gross deficits.   Results for orders placed during the hospital encounter of 04/29/12 (from the past 48 hour(s))  URINALYSIS, ROUTINE W REFLEX MICROSCOPIC     Status: Normal   Collection Time   04/29/12  1:18 PM      Component Value Range Comment   Color, Urine YELLOW  YELLOW    APPearance CLEAR  CLEAR    Specific Gravity, Urine 1.018  1.005 - 1.030    pH 6.0  5.0 - 8.0    Glucose, UA NEGATIVE  NEGATIVE mg/dL    Hgb urine dipstick NEGATIVE  NEGATIVE    Bilirubin Urine NEGATIVE  NEGATIVE    Ketones, ur NEGATIVE  NEGATIVE mg/dL    Protein, ur NEGATIVE  NEGATIVE mg/dL    Urobilinogen, UA 1.0  0.0 -  1.0 mg/dL    Nitrite NEGATIVE  NEGATIVE    Leukocytes, UA NEGATIVE  NEGATIVE MICROSCOPIC NOT DONE ON URINES WITH NEGATIVE PROTEIN, BLOOD, LEUKOCYTES, NITRITE, OR GLUCOSE <1000 mg/dL.  CBC WITH DIFFERENTIAL     Status: Abnormal   Collection Time   04/29/12  1:37 PM      Component Value Range Comment   WBC 9.3  4.0 - 10.5 K/uL    RBC 4.17  3.87 - 5.11 MIL/uL    Hemoglobin 13.0  12.0 - 15.0 g/dL    HCT 69.6  29.5 - 28.4 %    MCV 94.0  78.0 - 100.0 fL    MCH 31.2  26.0 - 34.0 pg    MCHC 33.2  30.0 - 36.0 g/dL    RDW 13.2  44.0 - 10.2 %    Platelets 188  150 - 400 K/uL    Neutrophils Relative 69  43 - 77 %    Neutro Abs 6.4  1.7 - 7.7 K/uL    Lymphocytes Relative 16  12 - 46 %    Lymphs Abs 1.5  0.7 - 4.0 K/uL      Monocytes Relative 13 (*) 3 - 12 %    Monocytes Absolute 1.2 (*) 0.1 - 1.0 K/uL    Eosinophils Relative 1  0 - 5 %    Eosinophils Absolute 0.1  0.0 - 0.7 K/uL    Basophils Relative 1  0 - 1 %    Basophils Absolute 0.1  0.0 - 0.1 K/uL   COMPREHENSIVE METABOLIC PANEL     Status: Abnormal   Collection Time   04/29/12  1:37 PM      Component Value Range Comment   Sodium 133 (*) 135 - 145 mEq/L    Potassium 4.2  3.5 - 5.1 mEq/L    Chloride 94 (*) 96 - 112 mEq/L    CO2 27  19 - 32 mEq/L    Glucose, Bld 121 (*) 70 - 99 mg/dL    BUN 15  6 - 23 mg/dL    Creatinine, Ser 7.25  0.50 - 1.10 mg/dL    Calcium 36.6  8.4 - 10.5 mg/dL    Total Protein 8.3  6.0 - 8.3 g/dL    Albumin 3.9  3.5 - 5.2 g/dL    AST 19  0 - 37 U/L    ALT 8  0 - 35 U/L    Alkaline Phosphatase 70  39 - 117 U/L    Total Bilirubin 0.8  0.3 - 1.2 mg/dL    GFR calc non Af Amer 78 (*) >90 mL/min    GFR calc Af Amer 90 (*) >90 mL/min   PRO B NATRIURETIC PEPTIDE     Status: Abnormal   Collection Time   04/29/12  1:48 PM      Component Value Range Comment   Pro B Natriuretic peptide (BNP) 580.5 (*) 0 - 450 pg/mL   CBC     Status: Normal   Collection Time   04/29/12  9:44 PM      Component Value Range Comment   WBC 8.3  4.0 - 10.5 K/uL    RBC 4.09  3.87 - 5.11 MIL/uL    Hemoglobin 12.2  12.0 - 15.0 g/dL    HCT 44.0  34.7 - 42.5 %    MCV 92.2  78.0 - 100.0 fL    MCH 29.8  26.0 - 34.0 pg    MCHC 32.4  30.0 - 36.0 g/dL  RDW 14.5  11.5 - 15.5 %    Platelets 179  150 - 400 K/uL   CREATININE, SERUM     Status: Abnormal   Collection Time   04/29/12  9:44 PM      Component Value Range Comment   Creatinine, Ser 0.51  0.50 - 1.10 mg/dL    GFR calc non Af Amer 81 (*) >90 mL/min    GFR calc Af Amer >90  >90 mL/min   TSH     Status: Normal   Collection Time   04/29/12  9:44 PM      Component Value Range Comment   TSH 1.426  0.350 - 4.500 uIU/mL   GLUCOSE, CAPILLARY     Status: Abnormal   Collection Time   04/30/12  7:44 AM       Component Value Range Comment   Glucose-Capillary 116 (*) 70 - 99 mg/dL   SEDIMENTATION RATE     Status: Abnormal   Collection Time   04/30/12  2:38 PM      Component Value Range Comment   Sed Rate 38 (*) 0 - 22 mm/hr   C-REACTIVE PROTEIN     Status: Abnormal   Collection Time   04/30/12  2:38 PM      Component Value Range Comment   CRP 12.9 (*) <0.60 mg/dL   CULTURE, BLOOD (ROUTINE X 2)     Status: Normal (Preliminary result)   Collection Time   04/30/12  3:05 PM      Component Value Range Comment   Specimen Description BLOOD RIGHT HAND      Special Requests BOTTLES DRAWN AEROBIC AND ANAEROBIC 10CC      Culture  Setup Time 04/30/2012 21:11      Culture        Value:        BLOOD CULTURE RECEIVED NO GROWTH TO DATE CULTURE WILL BE HELD FOR 5 DAYS BEFORE ISSUING A FINAL NEGATIVE REPORT   Report Status PENDING     CULTURE, BLOOD (ROUTINE X 2)     Status: Normal (Preliminary result)   Collection Time   04/30/12  3:06 PM      Component Value Range Comment   Specimen Description BLOOD LEFT HAND      Special Requests BOTTLES DRAWN AEROBIC AND ANAEROBIC 10CC      Culture  Setup Time 04/30/2012 21:11      Culture        Value:        BLOOD CULTURE RECEIVED NO GROWTH TO DATE CULTURE WILL BE HELD FOR 5 DAYS BEFORE ISSUING A FINAL NEGATIVE REPORT   Report Status PENDING     PROTIME-INR     Status: Abnormal   Collection Time   05/01/12  6:20 AM      Component Value Range Comment   Prothrombin Time 16.3 (*) 11.6 - 15.2 seconds    INR 1.34  0.00 - 1.49   CBC     Status: Abnormal   Collection Time   05/01/12  6:20 AM      Component Value Range Comment   WBC 8.3  4.0 - 10.5 K/uL    RBC 3.84 (*) 3.87 - 5.11 MIL/uL    Hemoglobin 11.8 (*) 12.0 - 15.0 g/dL    HCT 16.1 (*) 09.6 - 46.0 %    MCV 92.7  78.0 - 100.0 fL    MCH 30.7  26.0 - 34.0 pg    MCHC 33.1  30.0 - 36.0 g/dL  RDW 14.9  11.5 - 15.5 %    Platelets 178  150 - 400 K/uL   GLUCOSE, CAPILLARY     Status: Abnormal   Collection Time    05/01/12  7:54 AM      Component Value Range Comment   Glucose-Capillary 100 (*) 70 - 99 mg/dL    Dg Chest 2 View  05/01/1094  *RADIOLOGY REPORT*  Clinical Data: Cough.  Chest pain.  Urinary tract infection. Current history of hypertension and diabetes.  CHEST - 2 VIEW  Comparison: Two-view chest x-ray 03/17/2012, 02/12/2012.  Findings: Suboptimal inspiration due to body habitus which accounts for crowded bronchovascular markings at the bases and accentuates the cardiac silhouette.  Taking this into account, cardiac silhouette mildly enlarged but stable.  Thoracic aorta mildly tortuous and atherosclerotic, unchanged.  Hilar and mediastinal contours otherwise unremarkable.  Lungs clear.  Bronchovascular markings normal.  Pulmonary vascularity normal.  No pneumothorax. No pleural effusions.  Degenerative changes throughout the thoracic spine.  Severe degenerative changes involving both shoulders as noted previously.  IMPRESSION: Suboptimal inspiration.  Stable mild cardiomegaly.  No acute cardiopulmonary disease.   Original Report Authenticated By: Hulan Saas, M.D.    Dg Thoracolumabar Spine  04/29/2012  *RADIOLOGY REPORT*  Clinical Data: Back pain.  THORACOLUMBAR SPINE - 2 VIEW  Comparison: Chest radiographs obtained earlier today and previously.  Findings: Partially included a superior mediastinal mass, described on the chest radiographs dated 02/12/2012.  Enlarged heart. Extensive thoracolumbar spine degenerative changes and mild scoliosis.  The L3-L5 vertebrae appear fused with poorly defined margins.  The T11 and T12 vertebra also appear fused, with poorly defined margins.  IMPRESSION:  1.  Apparent vertebral fusion at multiple levels with poorly defined margins, as described above. 2.  Extensive thoracolumbar spine degenerative changes and mild scoliosis. 3.  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  This can be better determined with elective thyroid ultrasound or chest  CT with contrast.   Original Report Authenticated By: Beckie Salts, M.D.    Mr Lumbar Spine W Wo Contrast  04/30/2012  *RADIOLOGY REPORT*  Clinical Data: Low back pain.  MRI LUMBAR SPINE WITHOUT AND WITH CONTRAST  Technique:  Multiplanar and multiecho pulse sequences of the lumbar spine were obtained without and with intravenous contrast.  Contrast:  17 ml Multihance  Comparison: None.  Findings: The lowest full intervertebral disk space is labeled L5- S1.  If procedural intervention is to be performed, careful correlation with this numbering strategy is recommended.  There is a prominent degree of motion artifact.  Perhaps in order to compensate for this, the technologist chose a very large field of view.  This allows [place] to see all the way up to the T6 level, but has the unfortunate side effect of the a dramatically reduced spatial resolution.  Today's exam has reduced sensitivity and specificity for evaluation of the lumbar spine due to these factors.  Moreover, there is signal loss in the thoracic region due to coil positioning, and accordingly this is not considered a highly reliable assessment of the lower half of the thoracic spine.  There is prominent dextroconvex lumbar scoliosis with rotary component.  Interbody fusion noted at the L3-L4-L5, also with solid interbody fusion at T11-12 and possible interbody fusion at T10-11. Facet effusion is suspected at the L3 - L4-L5.  There is fluid signal intensity throughout the intervertebral disc at L2-3, with abnormal endplate edema and low-level endplate enhancement. On the left side there is paraspinal edema and  enhancement without obvious abscess.  The right side of the intervertebral disc does not demonstrate this fluid signal intensity.  There is also a small left facet effusion at the L2-3 level.  The conus medullaris appears unremarkable.  Conus level:  L1.  There is 3 mm of degenerative posterior subluxation at T12-L1 and L1-2.  There is a suggestion of  posterior osseous ridging at T7-8 and T9- 10. Additional findings at individual levels are as follows:  T10-11:  Suspected mild right foraminal stenosis due to uncinate and facet spurring.  Posterior osseous ridging.  T11-12:  Suspected mild to moderate right foraminal stenosis and mild central stenosis due to posterior osseous ridging and facet arthropathy.  T12-L1:  Borderline right foraminal stenosis and borderline central stenosis due to disc osteophyte complex.  L1-2:  Mild left foraminal stenosis due to facet arthropathy and disc osteophyte complex.  L2-3:  Aside from the findings noted above, there is moderate left foraminal stenosis and mild left subarticular lateral recess stenosis in addition to borderline central stenosis secondary to facet arthropathy, ligament flavum redundancy, and disc osteophyte complex.  L3-4:  Moderate central stenosis with mild left subarticular lateral recess stenosis and borderline left foraminal stenosis due to posterior osseous ridging and facet arthropathy.  L4-5:  No impingement.  L5 S1:  Moderate right and mild left foraminal stenosis along with mild right subarticular lateral recess stenosis secondary to disc osteophyte complex and facet arthropathy.  IMPRESSION:  1.  Suspicion for diskitis - osteomyelitis at the L2-3 level, with abnormal fluid signal intensity of the disc space, endplate edema and enhancement, and mild left paravertebral edema and enhancement. I do not observe findings characteristic of an epidural abscess currently. 2.  Prominent dextroconvex lumbar scoliosis with considerable thoracolumbar spondylosis and degenerative disc disease, resulting in moderate impingement at L2-3, L3-4, and L5-S1, as well as mild impingement at L1-2, T10-11, and T11-12. 2.  Reduced sensitivity due to motion artifact and large field of view.   Original Report Authenticated By: Gaylyn Rong, M.D.     Assessment/Plan L2-L3 discitis For Image guided disc aspiration today  as requested. Discussed procedure, including risks and complications with pt. Got Lovenox at 10pm last night. Has been NPO Dr. Deanne Coffer has reviewed images and agrees to do procedure. Consent signed in chart  Brayton El PA-C 05/01/2012, 9:27 AM

## 2012-05-01 NOTE — Progress Notes (Signed)
ANTIBIOTIC CONSULT NOTE - INITIAL  Pharmacy Consult for Vancomycin Indication: discitis/ osteomyelitis   No Known Allergies  Patient Measurements: Height: 5' (152.4 cm) Weight: 182 lb 12.2 oz (82.9 kg) IBW/kg (Calculated) : 45.5   Vital Signs: Temp: 98.5 F (36.9 C) (01/06 1300) Temp src: Oral (01/06 1300) BP: 115/66 mmHg (01/06 1300) Pulse Rate: 76  (01/06 1300) Intake/Output from previous day: 01/05 0701 - 01/06 0700 In: 700 [P.O.:700] Out: 2150 [Urine:2150] Intake/Output from this shift:    Labs:  [facility] [place] 05/01/12 0620 04/29/12 2144 04/29/12 1337  WBC 8.3 8.3 9.3  HGB 11.8* 12.2 13.0  PLT 178 179 188  LABCREA -- -- --  CREATININE -- 0.51 0.58   Estimated Creatinine Clearance: 42.9 ml/min (by C-G formula based on Cr of 0.51). No results found for this basename: VANCOTROUGH:2,VANCOPEAK:2,VANCORANDOM:2,GENTTROUGH:2,GENTPEAK:2,GENTRANDOM:2,TOBRATROUGH:2,TOBRAPEAK:2,TOBRARND:2,AMIKACINPEAK:2,AMIKACINTROU:2,AMIKACIN:2, in the last 72 hours   Microbiology: Recent Results (from the past 720 hour(s))  CULTURE, BLOOD (ROUTINE X 2)     Status: Normal (Preliminary result)   Collection Time   04/30/12  3:05 PM      Component Value Range Status Comment   Specimen Description BLOOD RIGHT HAND   Final    Special Requests BOTTLES DRAWN AEROBIC AND ANAEROBIC 10CC   Final    Culture  Setup Time 04/30/2012 21:11   Final    Culture     Final    Value:        BLOOD CULTURE RECEIVED NO GROWTH TO DATE CULTURE WILL BE HELD FOR 5 DAYS BEFORE ISSUING A FINAL NEGATIVE REPORT   Report Status PENDING   Incomplete   CULTURE, BLOOD (ROUTINE X 2)     Status: Normal (Preliminary result)   Collection Time   04/30/12  3:06 PM      Component Value Range Status Comment   Specimen Description BLOOD LEFT HAND   Final    Special Requests BOTTLES DRAWN AEROBIC AND ANAEROBIC 10CC   Final    Culture  Setup Time 04/30/2012 21:11   Final    Culture     Final    Value:        BLOOD CULTURE RECEIVED NO  GROWTH TO DATE CULTURE WILL BE HELD FOR 5 DAYS BEFORE ISSUING A FINAL NEGATIVE REPORT   Report Status PENDING   Incomplete     Medical History: Past Medical History  Diagnosis Date  . Hypertension   . GERD (gastroesophageal reflux disease)   . Hypothyroidism     Assessment: 28 YOF with acute back pain and found to have discitis/osteomyelitis at the L2-L3 level by MRI, s/p CT guaided disc aspirate today. Will start empiric antibiotics with vancomycin and rocephin. Cultures are pending. Pt. Is afebrile, wbc wnl. Scr 0.58, est. crcl ~ 43 ml/min. Also started rocephin 2g IV Q 24  1/5 blood cx x 2 >> pending 1/6 disc aspirate >> 1/6 AFB smear >> 1/6 fungus smear >>  Goal of Therapy:  Vancomycin trough level 15-20 mcg/ml  Plan:  - Vancomycin 750mg  IV Q 12hrs - F/u renal function and cultures - Check vancomycin trough at steady state.  Bayard Hugger, PharmD, BCPS  Clinical Pharmacist  Pager: [phone]  05/01/2012,4:18 PM

## 2012-05-01 NOTE — Progress Notes (Signed)
PATIENT DETAILS Name: Jeanne Thomas Age: 79 y.o. Sex: female Date of Birth: 12/12/1917 Admit Date: 04/29/2012 Admitting Physician Hollice Espy, MD [id number], Sherilyn Cooter, MD  Subjective: Continues to have intermittent back pain  Assessment/Plan: Principal Problem:  *L1-L2 discitis - Holding off on starting antibiotics - Patient for CT-guided disc aspirate today - Remains afebrile and without any leukocytosis - Blood cultures drawn on 1/5 currently pending - May need PICC line and antibiotics after disc aspirate done  Active Problems: Acute low back pain - Secondary to above - Continue with scheduled Tylenol - As needed Ultram - MRI of the LS-spine on 1/5 - confirming discitis  Active Problems:  Chronic diastolic heart failure -clinically compensated -c/w Lasix, and metoprolol -Echo this admit-only if she decompensates   HTN (hypertension) -controlled -continue with Metoprolol, Amlodipine and Avapro  Hypothyroidism -c/w Levothyroxine  Possible Thyroid Goitre -assymptomatic currently -given age/frailty-no w/u needed for this-can pursue outpatient w/u if needed   Obesity -counseled regarding importance of weight loss   GERD (gastroesophageal reflux disease) -c/w PPI  Disposition: Remain inpatient- seen by physical therapy-will get CIR. Although she lives alone she has excellent family support  DVT Prophylaxis: Prophylactic Lovenox - to resume post biopsy  Code Status: Full code   Procedures:  None  CONSULTS:  None  PHYSICAL EXAM: Vital signs in last 24 hours: Filed Vitals:   04/30/12 1120 04/30/12 1450 04/30/12 2107 05/01/12 0537  BP: 136/71 144/62 124/68 125/70  Pulse: 83 73 62 71  Temp:  99 F (37.2 C) 98.9 F (37.2 C) 97.6 F (36.4 C)  TempSrc:  Oral Oral Oral  Resp:  20 18 20   Height:      Weight:      SpO2:  95% 96% 98%    Weight change:  Body mass index is 35.69 kg/(m^2).   Gen Exam: Awake and alert with clear speech.   Neck:  Supple, No JVD.   Chest: B/L Clear.   CVS: S1 S2 Regular, no murmurs.  Abdomen: soft, BS +, non tender, non distended.  Extremities: no edema, lower extremities warm to touch. Neurologic: Non Focal. -but has gen weakness in b/l lower ext-4/5  Skin: No Rash.  Wounds: N/A.    Intake/Output from previous day:  Intake/Output Summary (Last 24 hours) at 05/01/12 1038 Last data filed at 04/30/12 2021  Gross per 24 hour  Intake    520 ml  Output   1450 ml  Net   -930 ml     LAB RESULTS: CBC  Lab 05/01/12 0620 04/29/12 2144 04/29/12 1337  WBC 8.3 8.3 9.3  HGB 11.8* 12.2 13.0  HCT 35.6* 37.7 39.2  PLT 178 179 188  MCV 92.7 92.2 94.0  MCH 30.7 29.8 31.2  MCHC 33.1 32.4 33.2  RDW 14.9 14.5 14.8  LYMPHSABS -- -- 1.5  MONOABS -- -- 1.2*  EOSABS -- -- 0.1  BASOSABS -- -- 0.1  BANDABS -- -- --    Chemistries   Lab 04/29/12 2144 04/29/12 1337  NA -- 133*  K -- 4.2  CL -- 94*  CO2 -- 27  GLUCOSE -- 121*  BUN -- 15  CREATININE 0.51 0.58  CALCIUM -- 10.1  MG -- --    CBG:  Lab 05/01/12 0754 04/30/12 0744  GLUCAP 100* 116*    GFR Estimated Creatinine Clearance: 42.9 ml/min (by C-G formula based on Cr of 0.51).  Coagulation profile  Lab 05/01/12 0620  INR 1.34  PROTIME --    Cardiac  Enzymes No results found for this basename: CK:3,CKMB:3,TROPONINI:3,MYOGLOBIN:3 in the last 168 hours  No components found with this basename: POCBNP:3 No results found for this basename: DDIMER:2 in the last 72 hours No results found for this basename: HGBA1C:2 in the last 72 hours No results found for this basename: CHOL:2,HDL:2,LDLCALC:2,TRIG:2,CHOLHDL:2,LDLDIRECT:2 in the last 72 hours  Basename 04/29/12 2144  TSH 1.426  T4TOTAL --  T3FREE --  THYROIDAB --   No results found for this basename: VITAMINB12:2,FOLATE:2,FERRITIN:2,TIBC:2,IRON:2,RETICCTPCT:2 in the last 72 hours No results found for this basename: LIPASE:2,AMYLASE:2 in the last 72 hours  Urine Studies No  results found for this basename: UACOL:2,UAPR:2,USPG:2,UPH:2,UTP:2,UGL:2,UKET:2,UBIL:2,UHGB:2,UNIT:2,UROB:2,ULEU:2,UEPI:2,UWBC:2,URBC:2,UBAC:2,CAST:2,CRYS:2,UCOM:2,BILUA:2 in the last 72 hours  MICROBIOLOGY: Recent Results (from the past 240 hour(s))  CULTURE, BLOOD (ROUTINE X 2)     Status: Normal (Preliminary result)   Collection Time   04/30/12  3:05 PM      Component Value Range Status Comment   Specimen Description BLOOD RIGHT HAND   Final    Special Requests BOTTLES DRAWN AEROBIC AND ANAEROBIC 10CC   Final    Culture  Setup Time 04/30/2012 21:11   Final    Culture     Final    Value:        BLOOD CULTURE RECEIVED NO GROWTH TO DATE CULTURE WILL BE HELD FOR 5 DAYS BEFORE ISSUING A FINAL NEGATIVE REPORT   Report Status PENDING   Incomplete   CULTURE, BLOOD (ROUTINE X 2)     Status: Normal (Preliminary result)   Collection Time   04/30/12  3:06 PM      Component Value Range Status Comment   Specimen Description BLOOD LEFT HAND   Final    Special Requests BOTTLES DRAWN AEROBIC AND ANAEROBIC 10CC   Final    Culture  Setup Time 04/30/2012 21:11   Final    Culture     Final    Value:        BLOOD CULTURE RECEIVED NO GROWTH TO DATE CULTURE WILL BE HELD FOR 5 DAYS BEFORE ISSUING A FINAL NEGATIVE REPORT   Report Status PENDING   Incomplete     RADIOLOGY STUDIES/RESULTS: Dg Chest 2 View  04/29/2012  *RADIOLOGY REPORT*  Clinical Data: Cough.  Chest pain.  Urinary tract infection. Current history of hypertension and diabetes.  CHEST - 2 VIEW  Comparison: Two-view chest x-ray 03/17/2012, 02/12/2012.  Findings: Suboptimal inspiration due to body habitus which accounts for crowded bronchovascular markings at the bases and accentuates the cardiac silhouette.  Taking this into account, cardiac silhouette mildly enlarged but stable.  Thoracic aorta mildly tortuous and atherosclerotic, unchanged.  Hilar and mediastinal contours otherwise unremarkable.  Lungs clear.  Bronchovascular markings normal.   Pulmonary vascularity normal.  No pneumothorax. No pleural effusions.  Degenerative changes throughout the thoracic spine.  Severe degenerative changes involving both shoulders as noted previously.  IMPRESSION: Suboptimal inspiration.  Stable mild cardiomegaly.  No acute cardiopulmonary disease.   Original Report Authenticated By: Hulan Saas, M.D.    Dg Thoracolumabar Spine  04/29/2012  *RADIOLOGY REPORT*  Clinical Data: Back pain.  THORACOLUMBAR SPINE - 2 VIEW  Comparison: Chest radiographs obtained earlier today and previously.  Findings: Partially included a superior mediastinal mass, described on the chest radiographs dated 02/12/2012.  Enlarged heart. Extensive thoracolumbar spine degenerative changes and mild scoliosis.  The L3-L5 vertebrae appear fused with poorly defined margins.  The T11 and T12 vertebra also appear fused, with poorly defined margins.  IMPRESSION:  1.  Apparent vertebral fusion at multiple  levels with poorly defined margins, as described above. 2.  Extensive thoracolumbar spine degenerative changes and mild scoliosis. 3.  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  This can be better determined with elective thyroid ultrasound or chest CT with contrast.   Original Report Authenticated By: Beckie Salts, M.D.     MEDICATIONS: Scheduled Meds:    . acetaminophen  1,000 mg Oral TID  . amLODipine  5 mg Oral Daily   And  . irbesartan  150 mg Oral Daily  . enoxaparin (LOVENOX) injection  30 mg Subcutaneous Q24H  . fluticasone  2 spray Each Nare Daily  . furosemide  20 mg Oral BID  . levothyroxine  25 mcg Oral QAC breakfast  . metoprolol succinate  25 mg Oral BID  . pantoprazole  40 mg Oral Daily  . polyethylene glycol  17 g Oral BID  . senna-docusate  1 tablet Oral Daily   Continuous Infusions:  PRN Meds:.ondansetron (ZOFRAN) IV, ondansetron, oxyCODONE, sodium phosphate, traMADol  Antibiotics: Anti-infectives    None        Jeoffrey Massed, MD  [facility] Pager:[phone]  If 7PM-7AM, please contact night-coverage [email or web address] Password [email or web address] 05/01/2012, 10:38 AM   LOS: 2 days

## 2012-05-02 DIAGNOSIS — M519 Unspecified thoracic, thoracolumbar and lumbosacral intervertebral disc disorder: Principal | ICD-10-CM

## 2012-05-02 DIAGNOSIS — E119 Type 2 diabetes mellitus without complications: Secondary | ICD-10-CM

## 2012-05-02 LAB — GLUCOSE, CAPILLARY: Glucose-Capillary: 126 mg/dL — ABNORMAL HIGH (ref 70–99)

## 2012-05-02 MED ORDER — SODIUM CHLORIDE 0.9 % IJ SOLN
10.0000 mL | INTRAMUSCULAR | Status: DC | PRN
  Administered 2012-05-03: 10 mL

## 2012-05-02 NOTE — Progress Notes (Signed)
Rehab admissions - Evaluated for possible admission.  Please see rehab consult done by Dr. Riley Kill recommending SNF.  Likely patient will need SNF when medically ready for discharge.  Call me for questions.  #[phone]

## 2012-05-02 NOTE — Progress Notes (Signed)
INFECTIOUS DISEASE PROGRESS NOTE  ID: Jeanne Thomas is a 79 y.o. female with   Principal Problem:  *Acute back pain Active Problems:  Chronic diastolic heart failure  HTN (hypertension)  Obesity  DM type 2 (diabetes mellitus, type 2)  GERD (gastroesophageal reflux disease)  Hypothyroid  Subjective: Without complaints  Abtx:  Anti-infectives     Start     Dose/Rate Route Frequency Ordered Stop   05/01/12 1800   cefTRIAXone (ROCEPHIN) 2 g in dextrose 5 % 50 mL IVPB        2 g 100 mL/hr over 30 Minutes Intravenous Every 24 hours 05/01/12 1608     05/01/12 1800   vancomycin (VANCOCIN) 750 mg in sodium chloride 0.9 % 150 mL IVPB        750 mg 150 mL/hr over 60 Minutes Intravenous Every 12 hours 05/01/12 1633            Medications:  Scheduled:   . acetaminophen  1,000 mg Oral TID  . amLODipine  5 mg Oral Daily   And  . irbesartan  150 mg Oral Daily  . cefTRIAXone (ROCEPHIN)  IV  2 g Intravenous Q24H  . enoxaparin (LOVENOX) injection  30 mg Subcutaneous Q24H  . fluticasone  2 spray Each Nare Daily  . furosemide  20 mg Oral BID  . levothyroxine  25 mcg Oral QAC breakfast  . metoprolol succinate  25 mg Oral BID  . pantoprazole  40 mg Oral Daily  . polyethylene glycol  17 g Oral BID  . senna-docusate  1 tablet Oral Daily  . vancomycin  750 mg Intravenous Q12H    Objective: Vital signs in last 24 hours: Temp:  [98.6 F (37 C)] 98.6 F (37 C) (01/07 0443) Pulse Rate:  [70-79] 70  (01/07 0443) Resp:  [18] 18  (01/07 0443) BP: (124-131)/(70-71) 131/71 mmHg (01/07 0443) SpO2:  [96 %-97 %] 97 % (01/07 0443)   General appearance: alert, cooperative and no distress Resp: clear to auscultation bilaterally Cardio: regular rate and rhythm GI: normal findings: bowel sounds normal and soft, non-tender  Lab Results  Basename 05/01/12 0620 04/29/12 2144  WBC 8.3 8.3  HGB 11.8* 12.2  HCT 35.6* 37.7  NA -- --  K -- --  CL -- --  CO2 -- --  BUN -- --    CREATININE -- 0.51  GLU -- --   Liver Panel No results found for this basename: PROT:2,ALBUMIN:2,AST:2,ALT:2,ALKPHOS:2,BILITOT:2,BILIDIR:2,IBILI:2 in the last 72 hours Sedimentation Rate  Basename 04/30/12 1438  ESRSEDRATE 38*   C-Reactive Protein  Basename 04/30/12 1438  CRP 12.9*    Microbiology: Recent Results (from the past 240 hour(s))  CULTURE, BLOOD (ROUTINE X 2)     Status: Normal (Preliminary result)   Collection Time   04/30/12  3:05 PM      Component Value Range Status Comment   Specimen Description BLOOD RIGHT HAND   Final    Special Requests BOTTLES DRAWN AEROBIC AND ANAEROBIC 10CC   Final    Culture  Setup Time 04/30/2012 21:11   Final    Culture     Final    Value:        BLOOD CULTURE RECEIVED NO GROWTH TO DATE CULTURE WILL BE HELD FOR 5 DAYS BEFORE ISSUING A FINAL NEGATIVE REPORT   Report Status PENDING   Incomplete   CULTURE, BLOOD (ROUTINE X 2)     Status: Normal (Preliminary result)   Collection Time   04/30/12  3:06 PM      Component Value Range Status Comment   Specimen Description BLOOD LEFT HAND   Final    Special Requests BOTTLES DRAWN AEROBIC AND ANAEROBIC 10CC   Final    Culture  Setup Time 04/30/2012 21:11   Final    Culture     Final    Value:        BLOOD CULTURE RECEIVED NO GROWTH TO DATE CULTURE WILL BE HELD FOR 5 DAYS BEFORE ISSUING A FINAL NEGATIVE REPORT   Report Status PENDING   Incomplete   CULTURE, ROUTINE-ABSCESS     Status: Normal (Preliminary result)   Collection Time   05/01/12 11:51 AM      Component Value Range Status Comment   Specimen Description ABSCESS BACK   Final    Special Requests L2 3 DISC ASPIRATION IN IR   Final    Gram Stain     Final    Value: RARE WBC PRESENT, PREDOMINANTLY PMN     NO SQUAMOUS EPITHELIAL CELLS SEEN     NO ORGANISMS SEEN   Culture NO GROWTH 1 DAY   Final    Report Status PENDING   Incomplete   FUNGUS CULTURE W SMEAR     Status: Normal (Preliminary result)   Collection Time   05/01/12 11:51 AM       Component Value Range Status Comment   Specimen Description ABSCESS L2 3 DISC ASPIRATION   Final    Special Requests NONE   Final    Fungal Smear NO YEAST OR FUNGAL ELEMENTS SEEN   Final    Culture CULTURE IN PROGRESS FOR FOUR WEEKS   Final    Report Status PENDING   Incomplete     Studies/Results: Ir Fl Guide Spinal/si Jt Inj Left  05/01/2012  *RADIOLOGY REPORT*  Clinical data:  Worsening low back pain.  MR demonstrates changes suggestive of diskitis/osteomyelitis L2-3.  LUMBAR DISC ASPIRATION UNDER FLUOROSCOPY  Technique and findings: The procedure, risks (including but not limited to bleeding, infection, organ damage), benefits, and alternatives were explained to the patient.  Questions regarding the procedure were encouraged and answered.  The patient understands and consents to the procedure.The patient was placed prone on the procedure table and the lumbar region prepped and draped usual sterile fashion. Maximal barrier sterile technique was utilized including caps, mask, sterile gowns, sterile gloves, sterile drape, hand hygiene and skin antiseptic.  Intravenous Fentanyl and Versed were administered as conscious sedation during continuous cardiorespiratory monitoring by the radiology RN, with a total moderate sedation time of seven minutes.  Under fluoroscopic guidance, a 16 gauge trocar needle was advanced into the L2-3 interspace from a right posterolateral approach. Needle tip position was confirmed within the interspace on AP and lateral fluoroscopy.  Approximate 1 ml of clear yellow fluid could be aspirated, sent for Gram stain, culture and sensitivity. The patient tolerated the procedure well.  No immediate complication.  IMPRESSION: Technically successful L2-3 disc aspiration under fluoroscopy.   Original Report Authenticated By: D. Andria Rhein, MD      Assessment/Plan: Discitis/Ostoemyelitis L2-3  DM2  Total days of antibiotics 1  Continue vanco/ceftriaxone  Await Cx (fungal stain  -) PIC Placed   Await studies from aspirate (routine/afb/fungal)    Johny Sax Infectious Diseases [phone] 05/02/2012, 3:10 PM   LOS: 3 days

## 2012-05-02 NOTE — Progress Notes (Signed)
Patient and family agreeable to plans to pursue SNF- bed search underway- will advise on bed offers in the morning and possible d/c per MD.  Reece Levy, MSW, LCSWA [phone]

## 2012-05-02 NOTE — Progress Notes (Signed)
Peripherally Inserted Central Catheter/Midline Placement  The IV Nurse has discussed with the patient and/or persons authorized to consent for the patient, the purpose of this procedure and the potential benefits and risks involved with this procedure.  The benefits include less needle sticks, lab draws from the catheter and patient may be discharged home with the catheter.  Risks include, but not limited to, infection, bleeding, blood clot (thrombus formation), and puncture of an artery; nerve damage and irregular heat beat.  Alternatives to this procedure were also discussed.  PICC/Midline Placement Documentation        Lisabeth Devoid 05/02/2012, 11:59 AM

## 2012-05-02 NOTE — Progress Notes (Signed)
Physical Therapy Treatment Patient Details Name: Jeanne Thomas MRN: [id number] DOB: [birth year] Today's Date: 05/02/2012 Time: 6213-0865 PT Time Calculation (min): 27 min  PT Assessment / Plan / Recommendation Comments on Treatment Session  Pt adm with diskitis.  Today pt worked only at [employer] due to pt getting ready to have a picc and pt's bed too high to safely attempt standing.    Follow Up Recommendations  SNF     Does the patient have the potential to tolerate intense rehabilitation     Barriers to Discharge        Equipment Recommendations  None recommended by PT    Recommendations for Other Services    Frequency Min 2X/week   Plan Discharge plan needs to be updated;Frequency needs to be updated    Precautions / Restrictions Precautions Precautions: Fall   Pertinent Vitals/Pain Back pain repositioned.    Mobility  Bed Mobility Bed Mobility: Sit to Supine;Scooting to [facility];Rolling Left Rolling Left: 3: Mod assist;With rail Supine to Sit: 3: Mod assist;HOB elevated Sitting - Scoot to Edge of Bed: 3: Mod assist Sit to Supine: 3: Mod assist;HOB flat Scooting to HOB: 1: +2 Total assist Scooting to [facility]: Patient Percentage: 0% Details for Bed Mobility Assistance: Assist to bring trunk up and hips to EOB.  Assist to bring legs back up into bed.    Exercises General Exercises - Lower Extremity Long Arc Quad: AAROM;Both;10 reps;Seated   PT Diagnosis:    PT Problem List:   PT Treatment Interventions:     PT Goals Acute Rehab PT Goals PT Goal: Rolling Supine to Left Side - Progress: Progressing toward goal PT Goal: Supine/Side to Sit - Progress: Progressing toward goal PT Goal: Sit to Supine/Side - Progress: Progressing toward goal  Visit Information  Last PT Received On: 05/02/12 Assistance Needed: +2    Subjective Data  Subjective: Pt states she has been sleeping in a lift chair since 2007.   Cognition  Overall Cognitive Status: Appears within functional limits  for tasks assessed/performed Arousal/Alertness: Awake/alert Orientation Level: Appears intact for tasks assessed Behavior During Session: [facility] for tasks performed    Balance  Static Sitting Balance Static Sitting - Balance Support: Bilateral upper extremity supported;Feet unsupported Static Sitting - Level of Assistance: 5: Stand by assistance;4: Min assist Static Sitting - Comment/# of Minutes: Sat x 15 minutes.  End of Session PT - End of Session Activity Tolerance: Patient tolerated treatment well Patient left: in bed;with call bell/phone within reach Nurse Communication: Mobility status   GP     [facility] 05/02/2012, 12:17 PM  [facility] PT [phone]

## 2012-05-02 NOTE — Progress Notes (Signed)
[facility] PROGRESS NOTE  Jeanne Thomas NWG:[id number] DOB: [birth year] DOA: 04/29/2012 PCP: Florentina Jenny, MD  Assessment/Plan: Principal Problem:  L1-L2 discitis  - Jeanne/P CT-guided disc aspirate 05/01/12 cultures -neg so far - Remains afebrile and without any leukocytosis  - Blood cultures drawn on 1/5 neg to date -  PICC line and antibiotics after disc aspirate done per ID -appreciate ID assistance -van/ceftriaxone day #2.  -will likely need 6 weeks  Active Problems:  Acute low back pain  - Secondary to above  - fair control with  scheduled Tylenol  - As needed Ultram  - MRI of the LS-spine on 1/5 - confirming discitis   Active Problems:  Chronic diastolic heart failure  -clinically compensated  -c/w Lasix, and metoprolol  -  Echo 05/01/12 estimated ejection fraction was in the range of 65% to 70%. Wall motion was normal. Grade 2 diastolic dysfunction.   HTN (hypertension)  -controlled SBP range 115-131 -continue with Metoprolol, Amlodipine and Avapro   Hypothyroidism  -c/w Levothyroxine   Possible Thyroid Goitre  -assymptomatic currently  -given age/frailty-no w/u needed for this-can pursue outpatient w/u if needed   Obesity  -counseled regarding importance of weight loss   GERD (gastroesophageal reflux disease)  -c/w PPI   Code Status: full Family Communication:  Disposition Plan: SNF likely tomorrow   Consultants:  ID  Procedures:  Fluro guided aspiration L2-3 05/01/12  Antibiotics:  Vanc/ceftriazxone 05/01/12 >>>  HPI/Subjective: Awake alert reports back pain NAD  Objective: Filed Vitals:   05/01/12 1230 05/01/12 1300 05/01/12 2146 05/02/12 0443  BP: 116/65 115/66 124/70 131/71  Pulse: 82 76 79 70  Temp: 98.9 F (37.2 C) 98.5 F (36.9 C) 98.6 F (37 C) 98.6 F (37 C)  TempSrc: Oral Oral Oral Oral  Resp: 18 18 18 18   Height:      Weight:      SpO2: 93% 99% 96% 97%    Intake/Output Summary (Last 24 hours) at 05/02/12 0938 Last data  filed at 05/02/12 0900  Gross per 24 hour  Intake    670 ml  Output   1850 ml  Net  -1180 ml   Filed Weights   04/29/12 1900  Weight: 82.9 kg (182 lb 12.2 oz)    Exam:   General:  Awake obese NAD  Cardiovascular: RRR No MGR No LEE  Respiratory: normal effort BSCTAB No rhonchi wheeze  Abdomen: obese soft +BS non-tender to palpation  Data Reviewed: Basic Metabolic Panel:  Lab 04/29/12 5784 04/29/12 1337  NA -- 133*  K -- 4.2  CL -- 94*  CO2 -- 27  GLUCOSE -- 121*  BUN -- 15  CREATININE 0.51 0.58  CALCIUM -- 10.1  MG -- --  PHOS -- --   Liver Function Tests:  Lab 04/29/12 1337  AST 19  ALT 8  ALKPHOS 70  BILITOT 0.8  PROT 8.3  ALBUMIN 3.9   No results found for this basename: LIPASE:5,AMYLASE:5 in the last 168 hours No results found for this basename: AMMONIA:5 in the last 168 hours CBC:  Lab 05/01/12 0620 04/29/12 2144 04/29/12 1337  WBC 8.3 8.3 9.3  NEUTROABS -- -- 6.4  HGB 11.8* 12.2 13.0  HCT 35.6* 37.7 39.2  MCV 92.7 92.2 94.0  PLT 178 179 188   Cardiac Enzymes: No results found for this basename: CKTOTAL:5,CKMB:5,CKMBINDEX:5,TROPONINI:5 in the last 168 hours BNP (last 3 results)  Basename 04/29/12 1348  PROBNP 580.5*   CBG:  Lab 05/02/12 0750 05/01/12 0754 04/30/12  0744  GLUCAP 126* 100* 116*    Recent Results (from the past 240 hour(Jeanne))  CULTURE, BLOOD (ROUTINE X 2)     Status: Normal (Preliminary result)   Collection Time   04/30/12  3:05 PM      Component Value Range Status Comment   Specimen Description BLOOD RIGHT HAND   Final    Special Requests BOTTLES DRAWN AEROBIC AND ANAEROBIC 10CC   Final    Culture  Setup Time 04/30/2012 21:11   Final    Culture     Final    Value:        BLOOD CULTURE RECEIVED NO GROWTH TO DATE CULTURE WILL BE HELD FOR 5 DAYS BEFORE ISSUING A FINAL NEGATIVE REPORT   Report Status PENDING   Incomplete   CULTURE, BLOOD (ROUTINE X 2)     Status: Normal (Preliminary result)   Collection Time   04/30/12  3:06  PM      Component Value Range Status Comment   Specimen Description BLOOD LEFT HAND   Final    Special Requests BOTTLES DRAWN AEROBIC AND ANAEROBIC 10CC   Final    Culture  Setup Time 04/30/2012 21:11   Final    Culture     Final    Value:        BLOOD CULTURE RECEIVED NO GROWTH TO DATE CULTURE WILL BE HELD FOR 5 DAYS BEFORE ISSUING A FINAL NEGATIVE REPORT   Report Status PENDING   Incomplete   CULTURE, ROUTINE-ABSCESS     Status: Normal (Preliminary result)   Collection Time   05/01/12 11:51 AM      Component Value Range Status Comment   Specimen Description ABSCESS BACK   Final    Special Requests L2 3 DISC ASPIRATION IN IR   Final    Gram Stain     Final    Value: RARE WBC PRESENT, PREDOMINANTLY PMN     NO SQUAMOUS EPITHELIAL CELLS SEEN     NO ORGANISMS SEEN   Culture PENDING   Incomplete    Report Status PENDING   Incomplete      Studies: Mr Lumbar Spine W Wo Contrast  04/30/2012  *RADIOLOGY REPORT*  Clinical Data: Low back pain.  MRI LUMBAR SPINE WITHOUT AND WITH CONTRAST  Technique:  Multiplanar and multiecho pulse sequences of the lumbar spine were obtained without and with intravenous contrast.  Contrast:  17 ml Multihance  Comparison: None.  Findings: The lowest full intervertebral disk space is labeled L5- S1.  If procedural intervention is to be performed, careful correlation with this numbering strategy is recommended.  There is a prominent degree of motion artifact.  Perhaps in order to compensate for this, the technologist chose a very large field of view.  This allows [place] to see all the way up to the T6 level, but has the unfortunate side effect of the a dramatically reduced spatial resolution.  Today'Jeanne exam has reduced sensitivity and specificity for evaluation of the lumbar spine due to these factors.  Moreover, there is signal loss in the thoracic region due to coil positioning, and accordingly this is not considered a highly reliable assessment of the lower half of the thoracic  spine.  There is prominent dextroconvex lumbar scoliosis with rotary component.  Interbody fusion noted at the L3-L4-L5, also with solid interbody fusion at T11-12 and possible interbody fusion at T10-11. Facet effusion is suspected at the L3 - L4-L5.  There is fluid signal intensity throughout the intervertebral disc at  L2-3, with abnormal endplate edema and low-level endplate enhancement. On the left side there is paraspinal edema and enhancement without obvious abscess.  The right side of the intervertebral disc does not demonstrate this fluid signal intensity.  There is also a small left facet effusion at the L2-3 level.  The conus medullaris appears unremarkable.  Conus level:  L1.  There is 3 mm of degenerative posterior subluxation at T12-L1 and L1-2.  There is a suggestion of posterior osseous ridging at T7-8 and T9- 10. Additional findings at individual levels are as follows:  T10-11:  Suspected mild right foraminal stenosis due to uncinate and facet spurring.  Posterior osseous ridging.  T11-12:  Suspected mild to moderate right foraminal stenosis and mild central stenosis due to posterior osseous ridging and facet arthropathy.  T12-L1:  Borderline right foraminal stenosis and borderline central stenosis due to disc osteophyte complex.  L1-2:  Mild left foraminal stenosis due to facet arthropathy and disc osteophyte complex.  L2-3:  Aside from the findings noted above, there is moderate left foraminal stenosis and mild left subarticular lateral recess stenosis in addition to borderline central stenosis secondary to facet arthropathy, ligament flavum redundancy, and disc osteophyte complex.  L3-4:  Moderate central stenosis with mild left subarticular lateral recess stenosis and borderline left foraminal stenosis due to posterior osseous ridging and facet arthropathy.  L4-5:  No impingement.  L5 S1:  Moderate right and mild left foraminal stenosis along with mild right subarticular lateral recess stenosis  secondary to disc osteophyte complex and facet arthropathy.  IMPRESSION:  1.  Suspicion for diskitis - osteomyelitis at the L2-3 level, with abnormal fluid signal intensity of the disc space, endplate edema and enhancement, and mild left paravertebral edema and enhancement. I do not observe findings characteristic of an epidural abscess currently. 2.  Prominent dextroconvex lumbar scoliosis with considerable thoracolumbar spondylosis and degenerative disc disease, resulting in moderate impingement at L2-3, L3-4, and L5-S1, as well as mild impingement at L1-2, T10-11, and T11-12. 2.  Reduced sensitivity due to motion artifact and large field of view.   Original Report Authenticated By: Gaylyn Rong, M.D.    Ir Fl Guide Spinal/si Jt Inj Left  05/01/2012  *RADIOLOGY REPORT*  Clinical data:  Worsening low back pain.  MR demonstrates changes suggestive of diskitis/osteomyelitis L2-3.  LUMBAR DISC ASPIRATION UNDER FLUOROSCOPY  Technique and findings: The procedure, risks (including but not limited to bleeding, infection, organ damage), benefits, and alternatives were explained to the patient.  Questions regarding the procedure were encouraged and answered.  The patient understands and consents to the procedure.The patient was placed prone on the procedure table and the lumbar region prepped and draped usual sterile fashion. Maximal barrier sterile technique was utilized including caps, mask, sterile gowns, sterile gloves, sterile drape, hand hygiene and skin antiseptic.  Intravenous Fentanyl and Versed were administered as conscious sedation during continuous cardiorespiratory monitoring by the radiology RN, with a total moderate sedation time of seven minutes.  Under fluoroscopic guidance, a 16 gauge trocar needle was advanced into the L2-3 interspace from a right posterolateral approach. Needle tip position was confirmed within the interspace on AP and lateral fluoroscopy.  Approximate 1 ml of clear yellow fluid  could be aspirated, sent for Gram stain, culture and sensitivity. The patient tolerated the procedure well.  No immediate complication.  IMPRESSION: Technically successful L2-3 disc aspiration under fluoroscopy.   Original Report Authenticated By: D. Andria Rhein, MD     Scheduled Meds:   . acetaminophen  1,000 mg Oral TID  . amLODipine  5 mg Oral Daily   And  . irbesartan  150 mg Oral Daily  . cefTRIAXone (ROCEPHIN)  IV  2 g Intravenous Q24H  . enoxaparin (LOVENOX) injection  30 mg Subcutaneous Q24H  . fluticasone  2 spray Each Nare Daily  . furosemide  20 mg Oral BID  . levothyroxine  25 mcg Oral QAC breakfast  . metoprolol succinate  25 mg Oral BID  . pantoprazole  40 mg Oral Daily  . polyethylene glycol  17 g Oral BID  . senna-docusate  1 tablet Oral Daily  . vancomycin  750 mg Intravenous Q12H   Continuous Infusions:   Principal Problem:  *Acute back pain Active Problems:  Chronic diastolic heart failure  HTN (hypertension)  Obesity  DM type 2 (diabetes mellitus, type 2)  GERD (gastroesophageal reflux disease)  Hypothyroid    Time spent: 30 minutes    [facility]  [facility]  If 8PM-8AM, please contact night-coverage at [email or web address], password [facility] 05/02/2012, 9:38 AM  LOS: 3 days    Attending Patient seen and examined. Remains afebrile. Back pain controlled.Disc culture neg so far. Blood cultures neg so far. Echo-no vegetations. Will need SNF on discharge, antibiotic regimen and duration per Infectious disease.  Agree with the documentation as above.  Jeanne Thomas

## 2012-05-03 LAB — BASIC METABOLIC PANEL
BUN: 7 mg/dL (ref 6–23)
Chloride: 96 mEq/L (ref 96–112)
Creatinine, Ser: 0.5 mg/dL (ref 0.50–1.10)
GFR calc Af Amer: >90 mL/min (ref >90)

## 2012-05-03 MED ORDER — POTASSIUM CHLORIDE CRYS ER 20 MEQ PO TBCR
60.0000 meq | EXTENDED_RELEASE_TABLET | Freq: Once | ORAL | Status: AC
  Administered 2012-05-03: 60 meq via ORAL
  Filled 2012-05-03: qty 3

## 2012-05-03 MED ORDER — DEXTROSE 5 % IV SOLN: 2.0000 g | INTRAVENOUS | Status: DC

## 2012-05-03 MED ORDER — HEPARIN SOD (PORK) LOCK FLUSH 100 UNIT/ML IV SOLN
250.0000 [IU] | INTRAVENOUS | Status: AC | PRN
  Administered 2012-05-03: 250 [IU]

## 2012-05-03 MED ORDER — DOCUSATE SODIUM 100 MG PO CAPS: 100.0000 mg | ORAL_CAPSULE | Freq: Every day | ORAL | Status: DC

## 2012-05-03 MED ORDER — VANCOMYCIN HCL 1000 MG IV SOLR: 750.0000 mg | INTRAVENOUS | Status: DC

## 2012-05-03 MED ORDER — ENOXAPARIN SODIUM 40 MG/0.4ML ~~LOC~~ SOLN
40.0000 mg | SUBCUTANEOUS | Status: DC
  Filled 2012-05-03: qty 0.4

## 2012-05-03 MED ORDER — TRAMADOL HCL 50 MG PO TABS: 50.0000 mg | ORAL_TABLET | Freq: Two times a day (BID) | ORAL | Status: DC | PRN

## 2012-05-03 NOTE — Clinical Social Work Placement (Signed)
     Clinical Social Work Department CLINICAL SOCIAL WORK PLACEMENT NOTE 05/03/2012  Patient:  Jeanne Thomas, Jeanne Thomas  Account Number:  [id number] Admit date:  04/29/2012  Clinical Social Worker:  Johnsie Cancel  Date/time:  05/03/2012 09:14 AM  Clinical Social Work is seeking post-discharge placement for this patient at the following level of care:   SKILLED NURSING   (*CSW will update this form in Epic as items are completed)   05/03/2012  Patient/family provided with [facility] Department of Clinical Social Works list of facilities offering this level of care within the geographic area requested by the patient (or if unable, by the patients family).  05/03/2012  Patient/family informed of their freedom to choose among providers that offer the needed level of care, that participate in Medicare, Medicaid or managed care program needed by the patient, have an available bed and are willing to accept the patient.  05/03/2012  Patient/family informed of [facility] ownership interest in [facility], as well as of the fact that they are under no obligation to receive care at this facility.  PASARR submitted to EDS on 05/03/2012 PASARR number received from EDS on 05/03/2012  FL2 transmitted to all facilities in geographic area requested by pt/family on  05/03/2012 FL2 transmitted to all facilities within larger geographic area on   Patient informed that his/her managed care company has contracts with or will negotiate with  certain facilities, including the following:     Patient/family informed of bed offers received:  05/03/2012 Patient chooses bed at [facility] Physician recommends and patient chooses bed at    Patient to be transferred to [facility] on  05/03/2012 Patient to be transferred to facility by ems  The following physician request were entered in Epic:   Additional Comments:

## 2012-05-03 NOTE — Care Management Note (Signed)
    Page 1 of 2   05/03/2012     5:35:32 PM   CARE MANAGEMENT NOTE 05/03/2012  Patient:  Jeanne Thomas, Jeanne Thomas   Account Number:  [id number]  Date Initiated:  05/01/2012  Documentation initiated by:  [facility]  Subjective/Objective Assessment:   dx acute back pain  admit- lives alone.  pta independent.     Action/Plan:   pt eval- rec snf   Anticipated DC Date:  05/02/2012   Anticipated DC Plan:  SKILLED NURSING FACILITY  In-house referral  Clinical Social Worker      DC Planning Services  CM consult      Choice offered to / List presented to:             Status of service:  Completed, signed off Medicare Important Message given?   (If response is "NO", the following Medicare IM given date fields will be blank) Date Medicare IM given:   Date Additional Medicare IM given:    Discharge Disposition:  SKILLED NURSING FACILITY  Per UR Regulation:  Reviewed for med. necessity/level of care/duration of stay  If discussed at Long Length of Stay Meetings, dates discussed:    Comments:  05/03/12 17:34 Letha Cape RN BSN [phone] patient dc to snf today, CSW following.   Elliot Cousin, RN Case Manager Signed CASE MANAGEMENT Progress Notes 05/01/2012 3:53 PM NCM spoke to pt and states she wants to go home with [facility] PT. Explained CSW will speak to her about the different facilities available. Pt states she has the home health aide that comes Mon-Sat. Will follow up with family to see who will assist pt once aide has left.  Jeanne Donning RN CCM Case Mgmt phone [phone]   Elliot Cousin, RN Case Manager Signed CASE MANAGEMENT Progress Notes 05/01/2012 11:21 AM NCM spoke to pt and states she lives alone. She has a sister, Monika Salk [phone] that can assist her at home. States she has [facility] CAP program with the aide coming Mon-Sat for five hours. She sleeps in her recliner at home. Had hospital bed in the past but could not sleep on hard mattress. Has RW and  3n1 at home. Pt is agreeable to IP rehab if she qualifies. Will verify with [facility] CAP program that she can have aide when IP rehab complete. Jeanne Donning RN CCM Case Mgmt phone [phone]

## 2012-05-03 NOTE — Progress Notes (Signed)
Report called at [facility] RN took report.

## 2012-05-03 NOTE — Discharge Summary (Signed)
Physician Discharge Summary  Jeanne Thomas ZOX:[id number] DOB: 22-May-1917 DOA: 04/29/2012  PCP: Florentina Jenny, MD  Admit date: 04/29/2012 Discharge date: 05/03/2012  Time spent: 40 minutes  Recommendations for Outpatient Follow-up:  1. Followup with primary care physician. 2. Followup with Dr. Ninetta [facility] in 4 weeks. 3. Needs vancomycin trough in a.m. to determine the appropriate vancomycin dosage.  Discharge Diagnoses:  Principal Problem:  *Acute back pain Active Problems:  Chronic diastolic heart failure  HTN (hypertension)  Obesity  DM type 2 (diabetes mellitus, type 2)  GERD (gastroesophageal reflux disease)  Hypothyroid   Discharge Condition: Stable  Diet recommendation: Regular diet  Filed Weights   04/29/12 1900  Weight: 82.9 kg (182 lb 12.2 oz)    History of present illness:  Jeanne Thomas is a 79 y.o. female  With past medical history of hypothyroidism, hypertension and diastolic heart failure who lives at home by herself. She normally spends most of her day in a chair, but is able to get up and with the use of the walker ambulate to the bathroom. For the past few days she's had worsening back pain in her lower back to the point where she cannot even get up. Her sister who lives close by visited her and was unable to help her get up. She brought in by ambulance to the emergency room. In the emergency room urinalysis was unremarkable. Initial blood work and chest x-ray unremarkable. Spinal x-rays were not done. Vital signs were noted to be stable. Hospitalists were called for further evaluation.   Hospital Course:   1. Discitis involving L1-L2 disc space: Patient was complaining about back pain, MRI of the back was done which showed discitis of L1-L2. CT-guided disc space aspiration was done on 10/13/2012 by interventional radiology, patient remained afebrile and without iliac psychosis. Blood culture also was obtained on  04/30/2012 prior to institution of antibiotics. PICC line was placed, Dr. Ninetta Lights of infection disease service evaluated the patient, recommended Rocephin and vancomycin. The culture still no growth to date, I discussed with Dr. Ninetta Lights and he recommended Rocephin and vancomycin for total of 6 weeks. Patient should followup with him in 4 weeks.  2. Acute low back pain: Secondary to above, MRI of the lumbar spine showed discitis, she had fair control of the pain with Tylenol, patient was on Ultram at home this will continued.  3. Chronic diastolic CHF: Clinically compensated, patient is on metoprolol and Lasix. This was continued throughout the hospital stay. 2-D echocardiogram on 05/01/2012 showed ejection fraction to be in 65-70% range, there is normal wall motion with grade 2 diastolic dysfunction.  4. Hypertension: Controlled blood pressure, his BP ranges 115 1231, patient is on Avapro, metoprolol and amlodipine. Patient needs good control of her blood pressure because of history of diastolic dysfunction.  5. Possible thyroid goiter: Patient is asymptomatic currently, given her age and frailty, no workup done in the hospital. If needed or can be done as outpatient but I highly doubted this will make any difference.  6. Hypothyroidism: Continue levothyroxine, patient also was evaluated by PT, recommended to discharge a skilled nursing facility, also she will be on prolonged IV antibiotics.   Procedures:  L2-3 CT-guided disc space aspiration done on 05/01/2012 by [facility].  Placement of PICC line on 05/02/2012  Consultations:  Dr. Johny Sax of infectious disease service  Discharge Exam: Filed Vitals:   05/02/12 2031 05/03/12 0556 05/03/12 0931 05/03/12 0932  BP: 153/74  137/68 151/59 151/59  Pulse: 67 69  74  Temp: 98.5 F (36.9 C) 98.3 F (36.8 C)    TempSrc: Axillary Oral    Resp: 20 16    Height:      Weight:      SpO2: 93% 95%     General: Alert and awake,  oriented x3, not in any acute distress. HEENT: anicteric sclera, pupils reactive to light and accommodation, EOMI CVS: S1-S2 clear, no murmur rubs or gallops Chest: clear to auscultation bilaterally, no wheezing, rales or rhonchi Abdomen: soft nontender, nondistended, normal bowel sounds, no organomegaly Extremities: no cyanosis, clubbing or edema noted bilaterally Neuro: Cranial nerves II-XII intact, no focal neurological deficits  Discharge Instructions  Discharge Orders    Future Orders Please Complete By Expires   Increase activity slowly          Medication List     As of 05/03/2012 12:45 PM    STOP taking these medications         cetirizine-pseudoephedrine 5-120 MG per tablet   Commonly known as: ZYRTEC-D      TAKE these medications         AZOR 5-20 MG per tablet   Generic drug: amLODipine-olmesartan   Take 1 tablet by mouth daily.      dextrose 5 % SOLN 50 mL with cefTRIAXone 2 G SOLR 2 g   Inject 2 g into the vein daily.      fluticasone 50 MCG/ACT nasal spray   Commonly known as: FLONASE   Place 2 sprays into the nose daily.      furosemide 20 MG tablet   Commonly known as: LASIX   Take 20 mg by mouth 2 (two) times daily.      levothyroxine 25 MCG tablet   Commonly known as: SYNTHROID, LEVOTHROID   Take 25 mcg by mouth daily.      metoprolol succinate 25 MG 24 hr tablet   Commonly known as: TOPROL-XL   Take 25 mg by mouth 2 (two) times daily.      omeprazole 20 MG capsule   Commonly known as: PRILOSEC   Take 40 mg by mouth daily.      potassium chloride SA 20 MEQ tablet   Commonly known as: K-DUR,KLOR-CON   Take 20 mEq by mouth daily.      senna-docusate 8.6-50 MG per tablet   Commonly known as: Senokot-S   Take 1 tablet by mouth daily.      sodium chloride 0.9 % SOLN 150 mL with vancomycin 1000 MG SOLR 750 mg   Inject 750 mg into the vein daily.      traMADol 50 MG tablet   Commonly known as: ULTRAM   Take 1 tablet (50 mg total) by mouth  every 12 (twelve) hours as needed. pain      Vitamin D3 2000 UNITS Tabs   Take 2,000 mg by mouth daily.           Follow-up Information    Follow up with Florentina Jenny, MD.   Contact information:   [phone] [place] [address] [phone]           The results of significant diagnostics from this hospitalization (including imaging, microbiology, ancillary and laboratory) are listed below for reference.    Significant Diagnostic Studies: Dg Chest 2 View  04/29/2012  *RADIOLOGY REPORT*  Clinical Data: Cough.  Chest pain.  Urinary tract infection. Current history of hypertension and diabetes.  CHEST - 2 VIEW  Comparison: Two-view chest x-ray 03/17/2012, 02/12/2012.  Findings: Suboptimal inspiration due to body habitus which accounts for crowded bronchovascular markings at the bases and accentuates the cardiac silhouette.  Taking this into account, cardiac silhouette mildly enlarged but stable.  Thoracic aorta mildly tortuous and atherosclerotic, unchanged.  Hilar and mediastinal contours otherwise unremarkable.  Lungs clear.  Bronchovascular markings normal.  Pulmonary vascularity normal.  No pneumothorax. No pleural effusions.  Degenerative changes throughout the thoracic spine.  Severe degenerative changes involving both shoulders as noted previously.  IMPRESSION: Suboptimal inspiration.  Stable mild cardiomegaly.  No acute cardiopulmonary disease.   Original Report Authenticated By: Hulan Saas, M.D.    Dg Thoracolumabar Spine  04/29/2012  *RADIOLOGY REPORT*  Clinical Data: Back pain.  THORACOLUMBAR SPINE - 2 VIEW  Comparison: Chest radiographs obtained earlier today and previously.  Findings: Partially included a superior mediastinal mass, described on the chest radiographs dated 02/12/2012.  Enlarged heart. Extensive thoracolumbar spine degenerative changes and mild scoliosis.  The L3-L5 vertebrae appear fused with poorly defined margins.  The T11 and T12 vertebra  also appear fused, with poorly defined margins.  IMPRESSION:  1.  Apparent vertebral fusion at multiple levels with poorly defined margins, as described above. 2.  Extensive thoracolumbar spine degenerative changes and mild scoliosis. 3.  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  This can be better determined with elective thyroid ultrasound or chest CT with contrast.   Original Report Authenticated By: Beckie Salts, M.D.    Mr Lumbar Spine W Wo Contrast  04/30/2012  *RADIOLOGY REPORT*  Clinical Data: Low back pain.  MRI LUMBAR SPINE WITHOUT AND WITH CONTRAST  Technique:  Multiplanar and multiecho pulse sequences of the lumbar spine were obtained without and with intravenous contrast.  Contrast:  17 ml Multihance  Comparison: None.  Findings: The lowest full intervertebral disk space is labeled L5- S1.  If procedural intervention is to be performed, careful correlation with this numbering strategy is recommended.  There is a prominent degree of motion artifact.  Perhaps in order to compensate for this, the technologist chose a very large field of view.  This allows [place] to see all the way up to the T6 level, but has the unfortunate side effect of the a dramatically reduced spatial resolution.  Today's exam has reduced sensitivity and specificity for evaluation of the lumbar spine due to these factors.  Moreover, there is signal loss in the thoracic region due to coil positioning, and accordingly this is not considered a highly reliable assessment of the lower half of the thoracic spine.  There is prominent dextroconvex lumbar scoliosis with rotary component.  Interbody fusion noted at the L3-L4-L5, also with solid interbody fusion at T11-12 and possible interbody fusion at T10-11. Facet effusion is suspected at the L3 - L4-L5.  There is fluid signal intensity throughout the intervertebral disc at L2-3, with abnormal endplate edema and low-level endplate enhancement. On the left side  there is paraspinal edema and enhancement without obvious abscess.  The right side of the intervertebral disc does not demonstrate this fluid signal intensity.  There is also a small left facet effusion at the L2-3 level.  The conus medullaris appears unremarkable.  Conus level:  L1.  There is 3 mm of degenerative posterior subluxation at T12-L1 and L1-2.  There is a suggestion of posterior osseous ridging at T7-8 and T9- 10. Additional findings at individual levels are as follows:  T10-11:  Suspected mild right foraminal  stenosis due to uncinate and facet spurring.  Posterior osseous ridging.  T11-12:  Suspected mild to moderate right foraminal stenosis and mild central stenosis due to posterior osseous ridging and facet arthropathy.  T12-L1:  Borderline right foraminal stenosis and borderline central stenosis due to disc osteophyte complex.  L1-2:  Mild left foraminal stenosis due to facet arthropathy and disc osteophyte complex.  L2-3:  Aside from the findings noted above, there is moderate left foraminal stenosis and mild left subarticular lateral recess stenosis in addition to borderline central stenosis secondary to facet arthropathy, ligament flavum redundancy, and disc osteophyte complex.  L3-4:  Moderate central stenosis with mild left subarticular lateral recess stenosis and borderline left foraminal stenosis due to posterior osseous ridging and facet arthropathy.  L4-5:  No impingement.  L5 S1:  Moderate right and mild left foraminal stenosis along with mild right subarticular lateral recess stenosis secondary to disc osteophyte complex and facet arthropathy.  IMPRESSION:  1.  Suspicion for diskitis - osteomyelitis at the L2-3 level, with abnormal fluid signal intensity of the disc space, endplate edema and enhancement, and mild left paravertebral edema and enhancement. I do not observe findings characteristic of an epidural abscess currently. 2.  Prominent dextroconvex lumbar scoliosis with considerable  thoracolumbar spondylosis and degenerative disc disease, resulting in moderate impingement at L2-3, L3-4, and L5-S1, as well as mild impingement at L1-2, T10-11, and T11-12. 2.  Reduced sensitivity due to motion artifact and large field of view.   Original Report Authenticated By: Gaylyn Rong, M.D.    Ir Fl Guide Spinal/si Jt Inj Left  05/01/2012  *RADIOLOGY REPORT*  Clinical data:  Worsening low back pain.  MR demonstrates changes suggestive of diskitis/osteomyelitis L2-3.  LUMBAR DISC ASPIRATION UNDER FLUOROSCOPY  Technique and findings: The procedure, risks (including but not limited to bleeding, infection, organ damage), benefits, and alternatives were explained to the patient.  Questions regarding the procedure were encouraged and answered.  The patient understands and consents to the procedure.The patient was placed prone on the procedure table and the lumbar region prepped and draped usual sterile fashion. Maximal barrier sterile technique was utilized including caps, mask, sterile gowns, sterile gloves, sterile drape, hand hygiene and skin antiseptic.  Intravenous Fentanyl and Versed were administered as conscious sedation during continuous cardiorespiratory monitoring by the radiology RN, with a total moderate sedation time of seven minutes.  Under fluoroscopic guidance, a 16 gauge trocar needle was advanced into the L2-3 interspace from a right posterolateral approach. Needle tip position was confirmed within the interspace on AP and lateral fluoroscopy.  Approximate 1 ml of clear yellow fluid could be aspirated, sent for Gram stain, culture and sensitivity. The patient tolerated the procedure well.  No immediate complication.  IMPRESSION: Technically successful L2-3 disc aspiration under fluoroscopy.   Original Report Authenticated By: D. Andria Rhein, MD     Microbiology: Recent Results (from the past 240 hour(s))  CULTURE, BLOOD (ROUTINE X 2)     Status: Normal (Preliminary result)    Collection Time   04/30/12  3:05 PM      Component Value Range Status Comment   Specimen Description BLOOD RIGHT HAND   Final    Special Requests BOTTLES DRAWN AEROBIC AND ANAEROBIC 10CC   Final    Culture  Setup Time 04/30/2012 21:11   Final    Culture     Final    Value:        BLOOD CULTURE RECEIVED NO GROWTH TO DATE CULTURE WILL BE HELD  FOR 5 DAYS BEFORE ISSUING A FINAL NEGATIVE REPORT   Report Status PENDING   Incomplete   CULTURE, BLOOD (ROUTINE X 2)     Status: Normal (Preliminary result)   Collection Time   04/30/12  3:06 PM      Component Value Range Status Comment   Specimen Description BLOOD LEFT HAND   Final    Special Requests BOTTLES DRAWN AEROBIC AND ANAEROBIC 10CC   Final    Culture  Setup Time 04/30/2012 21:11   Final    Culture     Final    Value:        BLOOD CULTURE RECEIVED NO GROWTH TO DATE CULTURE WILL BE HELD FOR 5 DAYS BEFORE ISSUING A FINAL NEGATIVE REPORT   Report Status PENDING   Incomplete   CULTURE, ROUTINE-ABSCESS     Status: Normal (Preliminary result)   Collection Time   05/01/12 11:51 AM      Component Value Range Status Comment   Specimen Description ABSCESS BACK   Final    Special Requests L2 3 DISC ASPIRATION IN IR   Final    Gram Stain     Final    Value: RARE WBC PRESENT, PREDOMINANTLY PMN     NO SQUAMOUS EPITHELIAL CELLS SEEN     NO ORGANISMS SEEN   Culture NO GROWTH 2 DAYS   Final    Report Status PENDING   Incomplete   AFB CULTURE WITH SMEAR     Status: Normal (Preliminary result)   Collection Time   05/01/12 11:51 AM      Component Value Range Status Comment   Specimen Description ABSCESS L2 3 DISC ASPIRATION   Final    Special Requests NONE   Final    ACID FAST SMEAR NO ACID FAST BACILLI SEEN   Final    Culture     Final    Value: CULTURE WILL BE EXAMINED FOR 6 WEEKS BEFORE ISSUING A FINAL REPORT   Report Status PENDING   Incomplete   FUNGUS CULTURE W SMEAR     Status: Normal (Preliminary result)   Collection Time   05/01/12 11:51 AM       Component Value Range Status Comment   Specimen Description ABSCESS L2 3 DISC ASPIRATION   Final    Special Requests NONE   Final    Fungal Smear NO YEAST OR FUNGAL ELEMENTS SEEN   Final    Culture CULTURE IN PROGRESS FOR FOUR WEEKS   Final    Report Status PENDING   Incomplete   CLOSTRIDIUM DIFFICILE BY PCR     Status: Normal   Collection Time   05/02/12  5:30 PM      Component Value Range Status Comment   C difficile by pcr NEGATIVE  NEGATIVE Final      Labs: Basic Metabolic Panel:  Lab 05/03/12 1610 04/29/12 2144 04/29/12 1337  NA 135 -- 133*  K 3.3* -- 4.2  CL 96 -- 94*  CO2 29 -- 27  GLUCOSE 132* -- 121*  BUN 7 -- 15  CREATININE 0.50 0.51 0.58  CALCIUM 9.2 -- 10.1  MG -- -- --  PHOS -- -- --   Liver Function Tests:  Lab 04/29/12 1337  AST 19  ALT 8  ALKPHOS 70  BILITOT 0.8  PROT 8.3  ALBUMIN 3.9   No results found for this basename: LIPASE:5,AMYLASE:5 in the last 168 hours No results found for this basename: AMMONIA:5 in the last 168 hours CBC:  Lab 05/01/12 0620 04/29/12 2144 04/29/12 1337  WBC 8.3 8.3 9.3  NEUTROABS -- -- 6.4  HGB 11.8* 12.2 13.0  HCT 35.6* 37.7 39.2  MCV 92.7 92.2 94.0  PLT 178 179 188   Cardiac Enzymes: No results found for this basename: CKTOTAL:5,CKMB:5,CKMBINDEX:5,TROPONINI:5 in the last 168 hours BNP: BNP (last 3 results)  Basename 04/29/12 1348  PROBNP 580.5*   CBG:  Lab 05/03/12 0739 05/02/12 0750 05/01/12 0754 04/30/12 0744  GLUCAP 117* 126* 100* 116*       Signed:  Obadiah Dennard A  [facility] 05/03/2012, 12:45 PM

## 2012-05-03 NOTE — Clinical Social Work Note (Signed)
Clinical Social Work Department CLINICAL SOCIAL WORK PLACEMENT NOTE 05/03/2012  Patient:  Jeanne Thomas, Jeanne Thomas  Account Number:  [id number] Admit date:  04/29/2012  Clinical Social Worker:  Johnsie Cancel  Date/time:  05/03/2012 09:14 AM  Clinical Social Work is seeking post-discharge placement for this patient at the following level of care:   SKILLED NURSING   (*CSW will update this form in Epic as items are completed)   05/03/2012  Patient/family provided with [facility] Department of Clinical Social Work's list of facilities offering this level of care within the geographic area requested by the patient (or if unable, by the patient's family).  05/03/2012  Patient/family informed of their freedom to choose among providers that offer the needed level of care, that participate in Medicare, Medicaid or managed care program needed by the patient, have an available bed and are willing to accept the patient.  05/03/2012  Patient/family informed of [facility]' ownership interest in [facility], as well as of the fact that they are under no obligation to receive care at this facility.  PASARR submitted to EDS on  PASARR number received from EDS on   FL2 transmitted to all facilities in geographic area requested by pt/family on  05/03/2012 FL2 transmitted to all facilities within larger geographic area on   Patient informed that his/her managed care company has contracts with or will negotiate with  certain facilities, including the following:     Patient/family informed of bed offers received:   Patient chooses bed at  Physician recommends and patient chooses bed at    Patient to be transferred to  on   Patient to be transferred to facility by   The following physician request were entered in Epic:   Additional Comments:  Lia Foyer, LCSWA [facility] Clinical Social Worker Contact #: [phone] (PRN)

## 2012-05-04 LAB — CULTURE, ROUTINE-ABSCESS: Culture: NO GROWTH

## 2012-05-06 LAB — CULTURE, BLOOD (ROUTINE X 2): Culture: NO GROWTH

## 2012-05-29 ENCOUNTER — Encounter: Payer: Self-pay | Admitting: Infectious Diseases

## 2012-05-29 ENCOUNTER — Ambulatory Visit (INDEPENDENT_AMBULATORY_CARE_PROVIDER_SITE_OTHER): Payer: Medicare Other | Admitting: Infectious Diseases

## 2012-05-29 VITALS — BP 146/81 | HR 106 | Temp 98.3°F | Wt 165.0 lb

## 2012-05-29 DIAGNOSIS — M4626 Osteomyelitis of vertebra, lumbar region: Secondary | ICD-10-CM

## 2012-05-29 DIAGNOSIS — M869 Osteomyelitis, unspecified: Secondary | ICD-10-CM

## 2012-05-29 LAB — FUNGUS CULTURE W SMEAR: Fungal Smear: NONE SEEN

## 2012-05-29 NOTE — Progress Notes (Signed)
  Subjective:    Patient ID: Jeanne Thomas, female    DOB: 1918/04/13, 79 y.o.   MRN: [id number]  HPI 79 y.o. female with DM2 (previously diet controlled), hypothyroid, HTN, heart failure, previously lived alone, presented January 2014 with worsening back pain over 2-3 days. She was found on MRI: 1. Suspicion for diskitis - osteomyelitis at the L2-3 level, with abnormal fluid signal intensity of the disc space, endplate edema and enhancement, and mild left paravertebral edema and enhancement. I do not observe findings characteristic of an epidural abscess currently. 2. Prominent dextroconvex lumbar scoliosis with considerable thoracolumbar spondylosis and degenerative disc disease, resulting in moderate impingement at L2-3, L3-4, and L5-S1, as well as mild impingement at L1-2, T10-11, and T11-12. 3. Reduced sensitivity due to motion artifact and large field of view.  She underwent IR aspirate on 1-6 and was started on vanco/ceftriaxone. Her Cx (rountine/fungal/AFB) were all negative.   She was d/c to SNF. Her back feels fine. Is not happy with her SNF. She and her family wants her to go home. Recently got cortisone shots in her knees but still having a lot of pain.No problems with PIC, no problems with her antibiotics.   She is at day 28/42  Review of Systems  Constitutional: Negative for fever, chills and appetite change.  Gastrointestinal: Positive for constipation.  Genitourinary: Negative for dysuria.  See HPI     Objective:   Physical Exam  Constitutional: She appears well-developed and well-nourished.  HENT:  Mouth/Throat: No oropharyngeal exudate.  Eyes: EOM are normal. Pupils are equal, round, and reactive to light.  Neck: Neck supple.  Cardiovascular: Normal rate, regular rhythm and normal heart sounds.   Pulmonary/Chest: Effort normal and breath sounds normal.  Abdominal: Soft. Bowel sounds are normal. There is no tenderness.  Musculoskeletal:       Arms:           Assessment & Plan:

## 2012-05-29 NOTE — Assessment & Plan Note (Signed)
She appears to be doing ok. She is pain free, has some difficulty with walking, more related to her knee pain. She has constipation but admits that this is chronic. Will ask her SNF to draw CRP, ESR, send me her CMP, CBC and Vanco levels (she and her family are worried that she is being dosed irregularly). Will ask that the SNF pull her PIC at the end of 6 weeks of rx (on 2-17). She will return to clinic as needed- I explained to pt and family that they should call if fevers, chills, increasing pain.

## 2012-06-13 LAB — AFB CULTURE WITH SMEAR (NOT AT ARMC)

## 2012-10-03 ENCOUNTER — Ambulatory Visit (INDEPENDENT_AMBULATORY_CARE_PROVIDER_SITE_OTHER): Payer: Medicare Other | Admitting: Infectious Diseases

## 2012-10-03 VITALS — BP 147/81 | HR 73 | Temp 98.2°F | Ht <= 58 in | Wt 155.0 lb

## 2012-10-03 DIAGNOSIS — M4626 Osteomyelitis of vertebra, lumbar region: Secondary | ICD-10-CM

## 2012-10-03 DIAGNOSIS — M869 Osteomyelitis, unspecified: Secondary | ICD-10-CM

## 2012-10-03 NOTE — Assessment & Plan Note (Signed)
Will check a MRI of her spine. She does not display overt si/sx of infection but she is diabetic and these can often be masked. She needs PT at her facility to help improve her functional capacity. Will check her labs today. See her back in 2-3 weeks.

## 2012-10-03 NOTE — Progress Notes (Signed)
  Subjective:    Patient ID: Jeanne Thomas, female    DOB: 1917/06/25, 79 y.o.   MRN: [id number]  HPI  79 y.o. female with DM2 (previously diet controlled), hypothyroid, HTN, heart failure, previously lived alone, presented January 2014 with worsening back pain over 2-3 days. She was found on MRI: 1. Suspicion for diskitis - osteomyelitis at the L2-3 level, with abnormal fluid signal intensity of the disc space, endplate edema and enhancement, and mild left paravertebral edema and enhancement. I do not observe findings characteristic of an epidural abscess currently. 2. Prominent dextroconvex lumbar scoliosis with considerable thoracolumbar spondylosis and degenerative disc disease, resulting in moderate impingement at L2-3, L3-4, and L5-S1, as well as mild impingement at L1-2, T10-11, and T11-12. 3. Reduced sensitivity due to motion artifact and large field of view.  She underwent IR aspirate on 1-6 and was started on vanco/ceftriaxone. Her Cx (rountine/fungal/AFB) were all negative. She was to have completed anbx on 06-12-12.  Still having back pain. Having loose BM- mostly after eating. Per her sister this is a chronic problem- intermittent  Constipation and diarrhea.  No f/c. Her sister feels that she her infection in her back is recurring. Has not regained her function, ability to perform her ADLs.  Would like an x-ray to see if the infection in her back has returned.   Review of Systems     Objective:   Physical Exam  Constitutional: She appears well-developed and well-nourished.  HENT:  Mouth/Throat: No oropharyngeal exudate.  Eyes: EOM are normal.  Neck: Neck supple.  Cardiovascular: Normal rate, regular rhythm and normal heart sounds.   Pulmonary/Chest: Effort normal and breath sounds normal.  Abdominal: Soft. Bowel sounds are normal. There is no tenderness. There is no rebound.  Musculoskeletal:       Arms: Lymphadenopathy:    She has no cervical adenopathy.            Assessment & Plan:

## 2012-10-04 ENCOUNTER — Telehealth: Payer: Self-pay | Admitting: *Deleted

## 2012-10-04 NOTE — Telephone Encounter (Signed)
Requested Jennifer at [facility] to draw labs (BMP, CBC, ESR, CRP) at their next lab draw.  Will be done Friday, 10/06/12.  Pt then needs MRI scheduled for the next week re: worsening back pain, r/o reinfection. Andree Coss, RN

## 2012-10-10 ENCOUNTER — Other Ambulatory Visit: Payer: Self-pay | Admitting: *Deleted

## 2012-10-10 ENCOUNTER — Telehealth: Payer: Self-pay | Admitting: *Deleted

## 2012-10-10 DIAGNOSIS — M869 Osteomyelitis, unspecified: Secondary | ICD-10-CM

## 2012-10-10 NOTE — Telephone Encounter (Signed)
Pt's nurse Charlene notified of MRI appointment 10/12/12 at 3:00 with arrival at 2:45.  Per scheduler's request, lab work from 10/06/12 faxed to [phone].   Andree Coss, RN

## 2012-10-12 ENCOUNTER — Ambulatory Visit (HOSPITAL_COMMUNITY)
Admission: RE | Admit: 2012-10-12 | Discharge: 2012-10-12 | Disposition: A | Discharge status: Home / Self Care | Payer: Medicare Other | Source: Ambulatory Visit | Attending: Infectious Diseases | Admitting: Infectious Diseases

## 2012-10-12 DIAGNOSIS — E119 Type 2 diabetes mellitus without complications: Secondary | ICD-10-CM | POA: Insufficient documentation

## 2012-10-12 DIAGNOSIS — M47814 Spondylosis without myelopathy or radiculopathy, thoracic region: Secondary | ICD-10-CM | POA: Insufficient documentation

## 2012-10-12 DIAGNOSIS — M869 Osteomyelitis, unspecified: Secondary | ICD-10-CM

## 2012-10-12 MED ORDER — GADOBENATE DIMEGLUMINE 529 MG/ML IV SOLN
10.0000 mL | Freq: Once | INTRAVENOUS | Status: AC | PRN
  Administered 2012-10-12: 10 mL via INTRAVENOUS

## 2012-10-30 ENCOUNTER — Ambulatory Visit: Payer: Medicare Other | Admitting: Infectious Diseases

## 2013-02-28 ENCOUNTER — Emergency Department (HOSPITAL_COMMUNITY)
Admission: EM | Admit: 2013-02-28 | Discharge: 2013-02-28 | Disposition: A | Discharge status: Skilled Nursing Facility | Payer: Medicare Other | Attending: Emergency Medicine | Admitting: Emergency Medicine

## 2013-02-28 ENCOUNTER — Encounter (HOSPITAL_COMMUNITY): Payer: Self-pay | Admitting: Emergency Medicine

## 2013-02-28 DIAGNOSIS — K219 Gastro-esophageal reflux disease without esophagitis: Secondary | ICD-10-CM | POA: Insufficient documentation

## 2013-02-28 DIAGNOSIS — E039 Hypothyroidism, unspecified: Secondary | ICD-10-CM | POA: Insufficient documentation

## 2013-02-28 DIAGNOSIS — R040 Epistaxis: Secondary | ICD-10-CM | POA: Insufficient documentation

## 2013-02-28 DIAGNOSIS — I10 Essential (primary) hypertension: Secondary | ICD-10-CM | POA: Insufficient documentation

## 2013-02-28 DIAGNOSIS — Z79899 Other long term (current) drug therapy: Secondary | ICD-10-CM | POA: Insufficient documentation

## 2013-02-28 DIAGNOSIS — Z794 Long term (current) use of insulin: Secondary | ICD-10-CM | POA: Insufficient documentation

## 2013-02-28 DIAGNOSIS — R6889 Other general symptoms and signs: Secondary | ICD-10-CM | POA: Insufficient documentation

## 2013-02-28 DIAGNOSIS — J3489 Other specified disorders of nose and nasal sinuses: Secondary | ICD-10-CM | POA: Insufficient documentation

## 2013-02-28 MED ORDER — ONDANSETRON 4 MG PO TBDP
4.0000 mg | ORAL_TABLET | Freq: Once | ORAL | Status: AC
  Administered 2013-02-28: 4 mg via ORAL
  Filled 2013-02-28: qty 1

## 2013-02-28 NOTE — ED Notes (Signed)
Patient states she is feeling nauseous.  

## 2013-02-28 NOTE — ED Provider Notes (Signed)
Medical screening examination/treatment/procedure(s) were conducted as a shared visit with non-physician practitioner(s) and myself.  I personally evaluated the patient during the encounter Patient presents with acute episode of epistaxis. Symptoms are now resolved after holding pressure for 20 minutes. She has no other symptoms.  Loren Racer, MD 02/28/13 [phone]

## 2013-02-28 NOTE — ED Provider Notes (Signed)
CSN: [id number]     Arrival date & time 02/28/13  0355 History   First MD Initiated Contact with Patient 02/28/13 0400     Chief Complaint  Patient presents with  . Epistaxis   (Consider location/radiation/quality/duration/timing/severity/associated sxs/prior Treatment) HPI Comments: Patient states that when she gets a cold she also gets nose bleeds usually for the L nares Reports had 20 minutes of bleed that resolved with pressure  Arrives with no active bleeding   Patient is a 79 y.o. female presenting with nosebleeds. The history is provided by the patient.  Epistaxis Location:  L nare Severity:  Moderate Duration:  20 minutes Timing:  Intermittent Progression:  Resolved Chronicity:  Recurrent Context: hypertension and recent infection   Context: not anticoagulants, not aspirin use, not bleeding disorder and not home oxygen   Relieved by:  Applying pressure Worsened by:  Sneezing Associated symptoms: sneezing   Associated symptoms: no cough, no dizziness, no fever and no headaches   Risk factors: frequent nosebleeds and intranasal steroids     Past Medical History  Diagnosis Date  . Hypertension   . GERD (gastroesophageal reflux disease)   . Hypothyroidism    History reviewed. No pertinent past surgical history. No family history on file. History  Substance Use Topics  . Smoking status: Never Smoker   . Smokeless tobacco: Never Used  . Alcohol Use: No   OB History   Grav Para Term Preterm Abortions TAB SAB Ect Mult Living                 Review of Systems  Constitutional: Negative for fever.  HENT: Positive for nosebleeds, rhinorrhea and sneezing.   Respiratory: Negative for cough and shortness of breath.   Musculoskeletal: Negative for neck pain.  Neurological: Negative for dizziness and headaches.  All other systems reviewed and are negative.    Allergies  Review of patient's allergies indicates no known allergies.  Home Medications   Current  Outpatient Rx  Name  Route  Sig  Dispense  Refill  . amLODipine-olmesartan (AZOR) 5-20 MG per tablet   Oral   Take 1 tablet by mouth daily.         . Cholecalciferol (VITAMIN D3) 2000 UNITS TABS   Oral   Take 2,000 mg by mouth daily.         Marland Kitchen docusate sodium (COLACE) 100 MG capsule   Oral   Take 100 mg by mouth 2 (two) times daily as needed for constipation.         . fluticasone (FLONASE) 50 MCG/ACT nasal spray   Nasal   Place 2 sprays into the nose daily.   16 g   1   . furosemide (LASIX) 20 MG tablet   Oral   Take 20 mg by mouth 2 (two) times daily.         . insulin aspart protamine-insulin aspart (NOVOLOG 70/30) (70-30) 100 UNIT/ML injection   Subcutaneous   Inject 2 Units into the skin. Sliding scale         . levothyroxine (SYNTHROID, LEVOTHROID) 25 MCG tablet   Oral   Take 25 mcg by mouth daily.         . metoprolol succinate (TOPROL-XL) 25 MG 24 hr tablet   Oral   Take 25 mg by mouth 2 (two) times daily.         Marland Kitchen omeprazole (PRILOSEC) 20 MG capsule   Oral   Take 40 mg by mouth daily.         Marland Kitchen  oxyCODONE-acetaminophen (PERCOCET/ROXICET) 5-325 MG per tablet   Oral   Take 1 tablet by mouth every 8 (eight) hours as needed for pain.         . polyethylene glycol (MIRALAX / GLYCOLAX) packet   Oral   Take 17 g by mouth daily as needed.         . potassium chloride SA (K-DUR,KLOR-CON) 20 MEQ tablet   Oral   Take 20 mEq by mouth daily.         Marland Kitchen senna-docusate (SENOKOT-S) 8.6-50 MG per tablet   Oral   Take 1 tablet by mouth daily.         . temazepam (RESTORIL) 15 MG capsule   Oral   Take 15 mg by mouth at bedtime as needed for sleep.         . traMADol (ULTRAM) 50 MG tablet   Oral   Take 1 tablet (50 mg total) by mouth every 12 (twelve) hours as needed. pain   30 tablet   0    BP 163/66  Pulse 71  Temp(Src) 98.3 F (36.8 C) (Oral)  Resp 18  SpO2 96% Physical Exam  Nursing note and vitals reviewed. Constitutional:  She is oriented to person, place, and time. She appears well-developed and well-nourished. No distress.  HENT:  Head: Normocephalic and atraumatic.  Mouth/Throat: Oropharynx is clear and moist.  ried blood at end of both nares   Eyes: Pupils are equal, round, and reactive to light.  Cardiovascular: Normal rate.   Pulmonary/Chest: Effort normal.  Musculoskeletal: Normal range of motion.  Lymphadenopathy:    She has no cervical adenopathy.  Neurological: She is alert and oriented to person, place, and time.  Skin: Skin is warm and dry.    ED Course  Procedures (including critical care time) Labs Review Labs Reviewed - No data to display Imaging Review No results found.  EKG Interpretation   None       MDM   1. Mild epistaxis    Patient has had no further bleeding while in the ED    Arman Filter, NP 02/28/13 7829  Arman Filter, NP 02/28/13 0510

## 2013-02-28 NOTE — ED Notes (Signed)
Patient coming from [facility]. Patient experienced an episode of epistaxis which is now controlled. BP 163/66 and has a hx of HTN. Pt is A/o X4.

## 2015-10-02 ENCOUNTER — Emergency Department (HOSPITAL_COMMUNITY)
Admission: EM | Admit: 2015-10-02 | Discharge: 2015-10-02 | Disposition: A | Discharge status: Home / Self Care | Payer: Medicare Other | Attending: Emergency Medicine | Admitting: Emergency Medicine

## 2015-10-02 ENCOUNTER — Emergency Department (HOSPITAL_COMMUNITY): Payer: Medicare Other

## 2015-10-02 ENCOUNTER — Encounter (HOSPITAL_COMMUNITY): Payer: Self-pay

## 2015-10-02 DIAGNOSIS — Y939 Activity, unspecified: Secondary | ICD-10-CM | POA: Insufficient documentation

## 2015-10-02 DIAGNOSIS — W06XXXA Fall from bed, initial encounter: Secondary | ICD-10-CM | POA: Insufficient documentation

## 2015-10-02 DIAGNOSIS — Z79899 Other long term (current) drug therapy: Secondary | ICD-10-CM | POA: Insufficient documentation

## 2015-10-02 DIAGNOSIS — R0789 Other chest pain: Secondary | ICD-10-CM | POA: Diagnosis not present

## 2015-10-02 DIAGNOSIS — E039 Hypothyroidism, unspecified: Secondary | ICD-10-CM | POA: Diagnosis not present

## 2015-10-02 DIAGNOSIS — I10 Essential (primary) hypertension: Secondary | ICD-10-CM | POA: Diagnosis not present

## 2015-10-02 DIAGNOSIS — S0181XA Laceration without foreign body of other part of head, initial encounter: Secondary | ICD-10-CM | POA: Insufficient documentation

## 2015-10-02 DIAGNOSIS — W19XXXA Unspecified fall, initial encounter: Secondary | ICD-10-CM

## 2015-10-02 DIAGNOSIS — Y929 Unspecified place or not applicable: Secondary | ICD-10-CM | POA: Diagnosis not present

## 2015-10-02 DIAGNOSIS — Y999 Unspecified external cause status: Secondary | ICD-10-CM | POA: Diagnosis not present

## 2015-10-02 DIAGNOSIS — S0990XA Unspecified injury of head, initial encounter: Secondary | ICD-10-CM | POA: Diagnosis present

## 2015-10-02 MED ORDER — OXYCODONE HCL 5 MG PO TABS
2.5000 mg | ORAL_TABLET | Freq: Once | ORAL | Status: AC
  Administered 2015-10-02: 2.5 mg via ORAL
  Filled 2015-10-02: qty 1

## 2015-10-02 MED ORDER — ACETAMINOPHEN 500 MG PO TABS
1000.0000 mg | ORAL_TABLET | Freq: Once | ORAL | Status: AC
  Administered 2015-10-02: 1000 mg via ORAL
  Filled 2015-10-02: qty 2

## 2015-10-02 NOTE — ED Notes (Signed)
Hello

## 2015-10-02 NOTE — ED Notes (Signed)
Patient transported to X-ray 

## 2015-10-02 NOTE — ED Notes (Signed)
Report given to [place], RN/[facility].

## 2015-10-02 NOTE — Discharge Instructions (Signed)
Fall Prevention in the Home  Falls can cause injuries and can affect people from all age groups. There are many simple things that you can do to make your home safe and to help prevent falls. WHAT CAN I DO ON THE OUTSIDE OF MY HOME?  Regularly repair the edges of walkways and driveways and fix any cracks.  Remove high doorway thresholds.  Trim any shrubbery on the main path into your home.  Use bright outdoor lighting.  Clear walkways of debris and clutter, including tools and rocks.  Regularly check that handrails are securely fastened and in good repair. Both sides of any steps should have handrails.  Install guardrails along the edges of any raised decks or porches.  Have leaves, snow, and ice cleared regularly.  Use sand or salt on walkways during winter months.  In the garage, clean up any spills right away, including grease or oil spills. WHAT CAN I DO IN THE BATHROOM?  Use night lights.  Install grab bars by the toilet and in the tub and shower. Do not use towel bars as grab bars.  Use non-skid mats or decals on the floor of the tub or shower.  If you need to sit down while you are in the shower, use a plastic, non-slip stool..  Keep the floor dry. Immediately clean up any water that spills on the floor.  Remove soap buildup in the tub or shower on a regular basis.  Attach bath mats securely with double-sided non-slip rug tape.  Remove throw rugs and other tripping hazards from the floor. WHAT CAN I DO IN THE BEDROOM?  Use night lights.  Make sure that a bedside light is easy to reach.  Do not use oversized bedding that drapes onto the floor.  Have a firm chair that has side arms to use for getting dressed.  Remove throw rugs and other tripping hazards from the floor. WHAT CAN I DO IN THE KITCHEN?   Clean up any spills right away.  Avoid walking on wet floors.  Place frequently used items in easy-to-reach places.  If you need to reach for something  above you, use a sturdy step stool that has a grab bar.  Keep electrical cables out of the way.  Do not use floor polish or wax that makes floors slippery. If you have to use wax, make sure that it is non-skid floor wax.  Remove throw rugs and other tripping hazards from the floor. WHAT CAN I DO IN THE STAIRWAYS?  Do not leave any items on the stairs.  Make sure that there are handrails on both sides of the stairs. Fix handrails that are broken or loose. Make sure that handrails are as long as the stairways.  Check any carpeting to make sure that it is firmly attached to the stairs. Fix any carpet that is loose or worn.  Avoid having throw rugs at the top or bottom of stairways, or secure the rugs with carpet tape to prevent them from moving.  Make sure that you have a light switch at the top of the stairs and the bottom of the stairs. If you do not have them, have them installed. WHAT ARE SOME OTHER FALL PREVENTION TIPS?  Wear closed-toe shoes that fit well and support your feet. Wear shoes that have rubber soles or low heels.  When you use a stepladder, make sure that it is completely opened and that the sides are firmly locked. Have someone hold the ladder while you   are using it. Do not climb a closed stepladder.  Add color or contrast paint or tape to grab bars and handrails in your home. Place contrasting color strips on the first and last steps.  Use mobility aids as needed, such as canes, walkers, scooters, and crutches.  Turn on lights if it is dark. Replace any light bulbs that burn out.  Set up furniture so that there are clear paths. Keep the furniture in the same spot.  Fix any uneven floor surfaces.  Choose a carpet design that does not hide the edge of steps of a stairway.  Be aware of any and all pets.  Review your medicines with your healthcare provider. Some medicines can cause dizziness or changes in blood pressure, which increase your risk of falling. Talk  with your health care provider about other ways that you can decrease your risk of falls. This may include working with a physical therapist or trainer to improve your strength, balance, and endurance.   This information is not intended to replace advice given to you by your health care provider. Make sure you discuss any questions you have with your health care provider.   Document Released: 04/02/2002 Document Revised: 08/27/2014 Document Reviewed: 05/17/2014 Elsevier Interactive Patient Education 2016 [employer]  

## 2015-10-02 NOTE — ED Notes (Signed)
[facility] Patient was being moved from bed to a chair via hoyer lift and slid out of the lift yesterday. Staff reported negative x-rays of extremities and head. Patient continues to  C/o headache and rib pain.

## 2015-10-02 NOTE — ED Provider Notes (Signed)
CSN: [id number]     Arrival date & time 10/02/15  1217 History   First MD Initiated Contact with Patient 10/02/15 1224     Chief Complaint  Patient presents with  . Headache  . pain in rib      (Consider location/radiation/quality/duration/timing/severity/associated sxs/prior Treatment) Patient is a 82 y.o. female presenting with headaches and fall. The history is provided by the patient and the EMS personnel.  Headache Associated symptoms: neck pain   Associated symptoms: no congestion, no dizziness, no fever, no myalgias, no nausea and no vomiting   Fall This is a recurrent problem. The current episode started less than 1 hour ago. The problem occurs constantly. Associated symptoms include chest pain and headaches. Pertinent negatives include no shortness of breath. Nothing aggravates the symptoms. Nothing relieves the symptoms. She has tried nothing for the symptoms. The treatment provided no relief.   82 yo F With a chief complaint of a fall from her bed. She is trying to be transferred to the bed when she slipped on the pad that was laid down until the floor. Landed on her right side. Denies loss of consciousness. Denies blood thinner use. Denies other injury.  Past Medical History  Diagnosis Date  . Hypertension   . GERD (gastroesophageal reflux disease)   . Hypothyroidism    History reviewed. No pertinent past surgical history. History reviewed. No pertinent family history. Social History  Substance Use Topics  . Smoking status: Never Smoker   . Smokeless tobacco: Never Used  . Alcohol Use: No   OB History    No data available     Review of Systems  Constitutional: Negative for fever and chills.  HENT: Negative for congestion and rhinorrhea.   Eyes: Negative for redness and visual disturbance.  Respiratory: Negative for shortness of breath and wheezing.   Cardiovascular: Positive for chest pain. Negative for palpitations.  Gastrointestinal: Negative for nausea and  vomiting.  Genitourinary: Negative for dysuria and urgency.  Musculoskeletal: Positive for neck pain. Negative for myalgias and arthralgias.  Skin: Negative for pallor and wound.  Neurological: Positive for headaches. Negative for dizziness.      Allergies  Review of patient's allergies indicates no known allergies.  Home Medications   Prior to Admission medications   Medication Sig Start Date End Date Taking? Authorizing Provider  acetaminophen (TYLENOL) 325 MG tablet Take 650 mg by mouth 2 (two) times daily.   Yes Historical Provider, MD  amLODipine (NORVASC) 5 MG tablet Take 5 mg by mouth daily.   Yes Historical Provider, MD  bisacodyl (DULCOLAX) 5 MG EC tablet Take 5 mg by mouth daily as needed for mild constipation or moderate constipation.   Yes Historical Provider, MD  Cholecalciferol (VITAMIN D3) 2000 UNITS TABS Take 2,000 mg by mouth daily.   Yes Historical Provider, MD  docusate sodium (COLACE) 100 MG capsule Take 100 mg by mouth 3 (three) times daily.    Yes Historical Provider, MD  furosemide (LASIX) 20 MG tablet Take 20 mg by mouth 2 (two) times daily.   Yes Historical Provider, MD  guaiFENesin (ROBITUSSIN) 100 MG/5ML liquid Take 200 mg by mouth every 4 (four) hours as needed for cough.   Yes Historical Provider, MD  levothyroxine (SYNTHROID, LEVOTHROID) 25 MCG tablet Take 12.5 mcg by mouth daily.    Yes Historical Provider, MD  lisinopril (PRINIVIL,ZESTRIL) 2.5 MG tablet Take 2.5 mg by mouth daily.   Yes Historical Provider, MD  loperamide (IMODIUM) 2 MG capsule Take 2  mg by mouth as needed for diarrhea or loose stools.   Yes Historical Provider, MD  loratadine (CLARITIN) 10 MG tablet Take 10 mg by mouth daily.   Yes Historical Provider, MD  meclizine (ANTIVERT) 12.5 MG tablet Take 12.5 mg by mouth 2 (two) times daily.   Yes Historical Provider, MD  Melatonin 3 MG TABS Take 3 mg by mouth at bedtime.   Yes Historical Provider, MD  metoprolol tartrate (LOPRESSOR) 25 MG tablet  Take 12.5 mg by mouth 2 (two) times daily.   Yes Historical Provider, MD  omeprazole (PRILOSEC) 20 MG capsule Take 20 mg by mouth daily.    Yes Historical Provider, MD  oxyCODONE-acetaminophen (PERCOCET/ROXICET) 5-325 MG per tablet Take 1 tablet by mouth at bedtime. May take an additional tablet every 8 hours as needed for pain/restlessness   Yes Historical Provider, MD  polyethylene glycol (MIRALAX / GLYCOLAX) packet Take 17 g by mouth daily.    Yes Historical Provider, MD  potassium chloride SA (K-DUR,KLOR-CON) 20 MEQ tablet Take 20 mEq by mouth daily.   Yes Historical Provider, MD  senna-docusate (SENOKOT-S) 8.6-50 MG per tablet Take 1 tablet by mouth daily.   Yes Historical Provider, MD  traMADol (ULTRAM) 50 MG tablet Take 1 tablet (50 mg total) by mouth every 12 (twelve) hours as needed. pain Patient taking differently: Take 50 mg by mouth 2 (two) times daily. pain 05/03/12  Yes Mutaz Elmahi, MD   BP 115/63 mmHg  Pulse 73  Temp(Src) 98.4 F (36.9 C) (Oral)  Resp 18  Ht  (1.143 m)  Wt 189 lb (85.73 kg)  BMI 65.62 kg/m2  SpO2 96% Physical Exam  Constitutional: She is oriented to person, place, and time. She appears well-developed and well-nourished. No distress.  HENT:  Head: Normocephalic and atraumatic.  Superficial laceration to the right temporal aspect.  Eyes: EOM are normal. Pupils are equal, round, and reactive to light.  Neck: Normal range of motion. Neck supple.  Cardiovascular: Normal rate and regular rhythm.  Exam reveals no gallop and no friction rub.   No murmur heard. Pulmonary/Chest: Effort normal. She has no wheezes. She has no rales. She exhibits tenderness (Tender palpation about the right lateral chest wall about ribs 4,5 and 6).  Abdominal: Soft. She exhibits no distension. There is no tenderness. There is no rebound.  Musculoskeletal: She exhibits no edema or tenderness.  Neurological: She is alert and oriented to person, place, and time.  Skin: Skin is warm  and dry. She is not diaphoretic.  Psychiatric: She has a normal mood and affect. Her behavior is normal.  Nursing note and vitals reviewed.   ED Course  Procedures (including critical care time) Labs Review Labs Reviewed - No data to display  Imaging Review Dg Ribs Unilateral W/chest Right  10/02/2015  CLINICAL DATA:  Pain after trauma. EXAM: RIGHT RIBS AND CHEST - 3+ VIEW COMPARISON:  April 29, 2012 chest x-ray FINDINGS: Widening of the mediastinum is consistent with the patient's known large bulky thyroid better seen on the CT of the cervical spine from today. The thyroid deviates the trachea to the left. This finding is unchanged since 2014. No pneumothorax. The heart, hila, and mediastinum are unchanged. No pulmonary nodules or masses. No focal infiltrates. Degenerative changes in the shoulders. No rib fractures identified on limited images. IMPRESSION: 1. No acute abnormalities. 2. Widened mediastinum due to known thyromegaly. Electronically Signed   By: Gerome Sam III M.D   On: 10/02/2015 13:31  Ct Head Wo Contrast  10/02/2015  CLINICAL DATA:  Pain following fall 1 day prior EXAM: CT HEAD WITHOUT CONTRAST CT CERVICAL SPINE WITHOUT CONTRAST TECHNIQUE: Multidetector CT imaging of the head and cervical spine was performed following the standard protocol without intravenous contrast. Multiplanar CT image reconstructions of the cervical spine were also generated. COMPARISON:  None. FINDINGS: CT HEAD FINDINGS There is mild diffuse atrophy. There is no intracranial mass, hemorrhage, extra-axial fluid collection, or midline shift. There is mild patchy small vessel disease in the centra semiovale bilaterally. Gray-white compartments elsewhere are within normal limits. No acute infarct is evident. There is a right occipital scalp hematoma. The bony calvarium appears intact. Mastoid air cells are clear on the left. On the right, mastoid air cells are hazy without air-fluid level. There is no  intraorbital lesion. CT CERVICAL SPINE FINDINGS There is no fracture. There is 3 mm of anterolisthesis of T2 on T3. There is no spondylolisthesis in the cervical region. Prevertebral soft tissues are prominent with what appears to be marked enlargement of the thyroid causing this prevertebral soft tissue prominence. Predental space region is normal. There is severe disc space narrowing at C6-7. There is severe disc space narrowing at T2-3. There is moderate disc space narrowing at C5-6. There is milder disc space narrowing at C4-5 and C7-T1. There is facet hypertrophy at most levels bilaterally. No disc extrusion or stenosis is identified. Marked enlargement of the thyroid, primarily the left lobe, is noted. Scattered small calcifications are noted in this region. IMPRESSION: CT head: Mild atrophy with patchy periventricular small vessel disease. No intracranial mass, hemorrhage, or extra-axial fluid collection. No acute infarct evident. There is a right occipital scalp hematoma. No fracture evident. CT cervical spine: No fracture. Slight anterolisthesis at T2-3 is felt to be due to underlying spondylosis. No cervical spine spondylolisthesis is apparent. There is osteoarthritic change at multiple levels. There is marked soft tissue fullness in the prevertebral region of the mid to lower cervical spine, felt to be due to markedly enlarged thyroid. Correlation with thyroid ultrasound to further evaluate may well be warranted given this appearance. Electronically Signed   By: Bretta Bang III M.D.   On: 10/02/2015 13:38   Ct Cervical Spine Wo Contrast  10/02/2015  CLINICAL DATA:  Pain following fall 1 day prior EXAM: CT HEAD WITHOUT CONTRAST CT CERVICAL SPINE WITHOUT CONTRAST TECHNIQUE: Multidetector CT imaging of the head and cervical spine was performed following the standard protocol without intravenous contrast. Multiplanar CT image reconstructions of the cervical spine were also generated. COMPARISON:   None. FINDINGS: CT HEAD FINDINGS There is mild diffuse atrophy. There is no intracranial mass, hemorrhage, extra-axial fluid collection, or midline shift. There is mild patchy small vessel disease in the centra semiovale bilaterally. Gray-white compartments elsewhere are within normal limits. No acute infarct is evident. There is a right occipital scalp hematoma. The bony calvarium appears intact. Mastoid air cells are clear on the left. On the right, mastoid air cells are hazy without air-fluid level. There is no intraorbital lesion. CT CERVICAL SPINE FINDINGS There is no fracture. There is 3 mm of anterolisthesis of T2 on T3. There is no spondylolisthesis in the cervical region. Prevertebral soft tissues are prominent with what appears to be marked enlargement of the thyroid causing this prevertebral soft tissue prominence. Predental space region is normal. There is severe disc space narrowing at C6-7. There is severe disc space narrowing at T2-3. There is moderate disc space narrowing at C5-6. There is  milder disc space narrowing at C4-5 and C7-T1. There is facet hypertrophy at most levels bilaterally. No disc extrusion or stenosis is identified. Marked enlargement of the thyroid, primarily the left lobe, is noted. Scattered small calcifications are noted in this region. IMPRESSION: CT head: Mild atrophy with patchy periventricular small vessel disease. No intracranial mass, hemorrhage, or extra-axial fluid collection. No acute infarct evident. There is a right occipital scalp hematoma. No fracture evident. CT cervical spine: No fracture. Slight anterolisthesis at T2-3 is felt to be due to underlying spondylosis. No cervical spine spondylolisthesis is apparent. There is osteoarthritic change at multiple levels. There is marked soft tissue fullness in the prevertebral region of the mid to lower cervical spine, felt to be due to markedly enlarged thyroid. Correlation with thyroid ultrasound to further evaluate may  well be warranted given this appearance. Electronically Signed   By: Bretta BangWilliam  Woodruff III M.D.   On: 10/02/2015 13:38   I have personally reviewed and evaluated these images and lab results as part of my medical decision-making.   EKG Interpretation None      MDM   Final diagnoses:  Fall, initial encounter    82 yo F with a chief complaint of a fall or being transferred. CT of the head and C-spine are negative. X-ray of the ribs without fracture. We'll have her follow-up with her family doctor.  2:31 PM:  I have discussed the diagnosis/risks/treatment options with the patient and family and believe the pt to be eligible for discharge home to follow-up with PCP. We also discussed returning to the ED immediately if new or worsening sx occur. We discussed the sx which are most concerning (e.g., sudden worsening pain, fever, inability to tolerate by mouth) that necessitate immediate return. Medications administered to the patient during their visit and any new prescriptions provided to the patient are listed below.  Medications given during this visit Medications  acetaminophen (TYLENOL) tablet 1,000 mg (1,000 mg Oral Given 10/02/15 1240)  oxyCODONE (Oxy IR/ROXICODONE) immediate release tablet 2.5 mg (2.5 mg Oral Given 10/02/15 1240)    New Prescriptions   No medications on file    The patient appears reasonably screen and/or stabilized for discharge and I doubt any other medical condition or other [facility] requiring further screening, evaluation, or treatment in the ED at this time prior to discharge.      Melene Planan Alvy Alsop, DO 10/02/15 1431

## 2015-10-02 NOTE — ED Notes (Addendum)
[facility] made aware of [facility]

## 2016-06-06 ENCOUNTER — Encounter (HOSPITAL_COMMUNITY): Payer: Self-pay | Admitting: *Deleted

## 2016-06-06 ENCOUNTER — Emergency Department (HOSPITAL_COMMUNITY): Payer: Medicare Other

## 2016-06-06 ENCOUNTER — Inpatient Hospital Stay (HOSPITAL_COMMUNITY)
Admission: EM | Admit: 2016-06-06 | Discharge: 2016-06-08 | DRG: 064 | Disposition: A | Discharge status: Hospice | Payer: Medicare Other | Attending: Internal Medicine | Admitting: Internal Medicine

## 2016-06-06 DIAGNOSIS — I6789 Other cerebrovascular disease: Secondary | ICD-10-CM | POA: Diagnosis not present

## 2016-06-06 DIAGNOSIS — Z7409 Other reduced mobility: Secondary | ICD-10-CM

## 2016-06-06 DIAGNOSIS — I610 Nontraumatic intracerebral hemorrhage in hemisphere, subcortical: Secondary | ICD-10-CM | POA: Diagnosis present

## 2016-06-06 DIAGNOSIS — M7989 Other specified soft tissue disorders: Secondary | ICD-10-CM | POA: Diagnosis not present

## 2016-06-06 DIAGNOSIS — Z8249 Family history of ischemic heart disease and other diseases of the circulatory system: Secondary | ICD-10-CM

## 2016-06-06 DIAGNOSIS — Z833 Family history of diabetes mellitus: Secondary | ICD-10-CM

## 2016-06-06 DIAGNOSIS — Z993 Dependence on wheelchair: Secondary | ICD-10-CM

## 2016-06-06 DIAGNOSIS — G8101 Flaccid hemiplegia affecting right dominant side: Secondary | ICD-10-CM | POA: Diagnosis present

## 2016-06-06 DIAGNOSIS — K219 Gastro-esophageal reflux disease without esophagitis: Secondary | ICD-10-CM | POA: Diagnosis present

## 2016-06-06 DIAGNOSIS — R402143 Coma scale, eyes open, spontaneous, at hospital admission: Secondary | ICD-10-CM | POA: Diagnosis present

## 2016-06-06 DIAGNOSIS — Z515 Encounter for palliative care: Secondary | ICD-10-CM | POA: Diagnosis not present

## 2016-06-06 DIAGNOSIS — R2991 Unspecified symptoms and signs involving the musculoskeletal system: Secondary | ICD-10-CM

## 2016-06-06 DIAGNOSIS — T783XXA Angioneurotic edema, initial encounter: Secondary | ICD-10-CM | POA: Diagnosis present

## 2016-06-06 DIAGNOSIS — R29810 Facial weakness: Secondary | ICD-10-CM | POA: Diagnosis present

## 2016-06-06 DIAGNOSIS — I63412 Cerebral infarction due to embolism of left middle cerebral artery: Secondary | ICD-10-CM

## 2016-06-06 DIAGNOSIS — I5032 Chronic diastolic (congestive) heart failure: Secondary | ICD-10-CM | POA: Diagnosis present

## 2016-06-06 DIAGNOSIS — I110 Hypertensive heart disease with heart failure: Secondary | ICD-10-CM | POA: Diagnosis present

## 2016-06-06 DIAGNOSIS — E669 Obesity, unspecified: Secondary | ICD-10-CM | POA: Diagnosis present

## 2016-06-06 DIAGNOSIS — R220 Localized swelling, mass and lump, head: Secondary | ICD-10-CM | POA: Diagnosis not present

## 2016-06-06 DIAGNOSIS — E039 Hypothyroidism, unspecified: Secondary | ICD-10-CM | POA: Diagnosis present

## 2016-06-06 DIAGNOSIS — R402363 Coma scale, best motor response, obeys commands, at hospital admission: Secondary | ICD-10-CM | POA: Diagnosis present

## 2016-06-06 DIAGNOSIS — Z66 Do not resuscitate: Secondary | ICD-10-CM | POA: Diagnosis present

## 2016-06-06 DIAGNOSIS — I63312 Cerebral infarction due to thrombosis of left middle cerebral artery: Principal | ICD-10-CM | POA: Diagnosis present

## 2016-06-06 DIAGNOSIS — E119 Type 2 diabetes mellitus without complications: Secondary | ICD-10-CM | POA: Diagnosis present

## 2016-06-06 DIAGNOSIS — I10 Essential (primary) hypertension: Secondary | ICD-10-CM

## 2016-06-06 DIAGNOSIS — Z7189 Other specified counseling: Secondary | ICD-10-CM

## 2016-06-06 DIAGNOSIS — Z823 Family history of stroke: Secondary | ICD-10-CM | POA: Diagnosis not present

## 2016-06-06 DIAGNOSIS — R29723 NIHSS score 23: Secondary | ICD-10-CM | POA: Diagnosis present

## 2016-06-06 DIAGNOSIS — I639 Cerebral infarction, unspecified: Secondary | ICD-10-CM

## 2016-06-06 DIAGNOSIS — R402213 Coma scale, best verbal response, none, at hospital admission: Secondary | ICD-10-CM | POA: Diagnosis present

## 2016-06-06 DIAGNOSIS — R4701 Aphasia: Secondary | ICD-10-CM | POA: Diagnosis present

## 2016-06-06 HISTORY — DX: Type 2 diabetes mellitus without complications: E11.9

## 2016-06-06 HISTORY — DX: Heart failure, unspecified: I50.9

## 2016-06-06 HISTORY — DX: Insomnia, unspecified: G47.00

## 2016-06-06 LAB — COMPREHENSIVE METABOLIC PANEL
ALT: 16 U/L (ref 14–54)
ANION GAP: 5 (ref 5–15)
AST: 37 U/L (ref 15–41)
Albumin: 3.2 g/dL — ABNORMAL LOW (ref 3.5–5.0)
Alkaline Phosphatase: 54 U/L (ref 38–126)
BUN: 16 mg/dL (ref 6–20)
CO2: 29 mmol/L (ref 22–32)
Calcium: 9.2 mg/dL (ref 8.9–10.3)
Chloride: 101 mmol/L (ref 101–111)
Creatinine, Ser: 0.69 mg/dL (ref 0.44–1.00)
GFR calc Af Amer: >60 mL/min (ref >60)
Glucose, Bld: 125 mg/dL — ABNORMAL HIGH (ref 65–99)
POTASSIUM: 5.4 mmol/L — AB (ref 3.5–5.1)
Sodium: 135 mmol/L (ref 135–145)
Total Bilirubin: 0.5 mg/dL (ref 0.3–1.2)
Total Protein: 6.5 g/dL (ref 6.5–8.1)

## 2016-06-06 LAB — I-STAT CHEM 8, ED
BUN: 26 mg/dL — ABNORMAL HIGH (ref 6–20)
CALCIUM ION: 1.18 mmol/L (ref 1.15–1.40)
CHLORIDE: 96 mmol/L — AB (ref 101–111)
Creatinine, Ser: 0.8 mg/dL (ref 0.44–1.00)
GLUCOSE: 163 mg/dL — AB (ref 65–99)
HCT: 44 % (ref 36.0–46.0)
Hemoglobin: 15 g/dL (ref 12.0–15.0)
Potassium: 5.1 mmol/L (ref 3.5–5.1)
SODIUM: 136 mmol/L (ref 135–145)
TCO2: 35 mmol/L (ref 0–100)

## 2016-06-06 LAB — DIFFERENTIAL
BASOS ABS: 0.1 10*3/uL (ref 0.0–0.1)
BASOS PCT: 1 %
EOS ABS: 0.4 10*3/uL (ref 0.0–0.7)
EOS PCT: 6 %
LYMPHS ABS: 2.3 10*3/uL (ref 0.7–4.0)
Lymphocytes Relative: 31 %
Monocytes Absolute: 0.8 10*3/uL (ref 0.1–1.0)
Monocytes Relative: 11 %
NEUTROS PCT: 51 %
Neutro Abs: 3.9 10*3/uL (ref 1.7–7.7)

## 2016-06-06 LAB — CBC
HCT: 38.9 % (ref 36.0–46.0)
HEMOGLOBIN: 12.8 g/dL (ref 12.0–15.0)
MCH: 31.5 pg (ref 26.0–34.0)
MCHC: 32.9 g/dL (ref 30.0–36.0)
MCV: 95.8 fL (ref 78.0–100.0)
PLATELETS: 223 10*3/uL (ref 150–400)
RBC: 4.06 MIL/uL (ref 3.87–5.11)
RDW: 14 % (ref 11.5–15.5)
WBC: 7.4 10*3/uL (ref 4.0–10.5)

## 2016-06-06 LAB — GLUCOSE, CAPILLARY
GLUCOSE-CAPILLARY: 160 mg/dL — AB (ref 65–99)
GLUCOSE-CAPILLARY: 126 mg/dL — AB (ref 65–99)
Glucose-Capillary: 146 mg/dL — ABNORMAL HIGH (ref 65–99)

## 2016-06-06 LAB — I-STAT TROPONIN, ED: TROPONIN I, POC: 0.02 ng/mL (ref 0.00–0.08)

## 2016-06-06 LAB — PROTIME-INR
INR: 1.11
Prothrombin Time: 14.4 seconds (ref 11.4–15.2)

## 2016-06-06 LAB — ETHANOL: Alcohol, Ethyl (B): <5 mg/dL (ref <5)

## 2016-06-06 LAB — APTT: APTT: 26 s (ref 24–36)

## 2016-06-06 MED ORDER — ASPIRIN 300 MG RE SUPP
300.0000 mg | Freq: Every day | RECTAL | Status: DC
  Administered 2016-06-06 – 2016-06-07 (×2): 300 mg via RECTAL
  Filled 2016-06-06 (×3): qty 1

## 2016-06-06 MED ORDER — SODIUM CHLORIDE 0.9 % IV BOLUS (SEPSIS)
1000.0000 mL | Freq: Once | INTRAVENOUS | Status: AC
  Administered 2016-06-06: 1000 mL via INTRAVENOUS

## 2016-06-06 MED ORDER — STROKE: EARLY STAGES OF RECOVERY BOOK: Freq: Once | Status: DC

## 2016-06-06 MED ORDER — IOPAMIDOL (ISOVUE-370) INJECTION 76%
INTRAVENOUS | Status: AC
  Filled 2016-06-06: qty 50

## 2016-06-06 MED ORDER — ALTEPLASE (STROKE) FULL DOSE INFUSION
0.9000 mg/kg | Freq: Once | INTRAVENOUS | Status: DC
  Filled 2016-06-06: qty 100

## 2016-06-06 MED ORDER — SODIUM CHLORIDE 0.9 % IV SOLN: 50.0000 mL | Freq: Once | INTRAVENOUS | Status: DC

## 2016-06-06 MED ORDER — ASPIRIN 325 MG PO TABS
325.0000 mg | ORAL_TABLET | Freq: Every day | ORAL | Status: DC
  Administered 2016-06-08: 325 mg via ORAL
  Filled 2016-06-06: qty 1

## 2016-06-06 MED ORDER — ACETAMINOPHEN 650 MG RE SUPP: 650.0000 mg | RECTAL | Status: DC | PRN

## 2016-06-06 MED ORDER — INSULIN ASPART 100 UNIT/ML ~~LOC~~ SOLN
0.0000 [IU] | SUBCUTANEOUS | Status: DC
  Administered 2016-06-06: 2 [IU] via SUBCUTANEOUS
  Administered 2016-06-06 – 2016-06-07 (×4): 1 [IU] via SUBCUTANEOUS

## 2016-06-06 MED ORDER — FAMOTIDINE IN NACL 20-0.9 MG/50ML-% IV SOLN
20.0000 mg | INTRAVENOUS | Status: AC
  Administered 2016-06-06: 20 mg via INTRAVENOUS
  Filled 2016-06-06: qty 50

## 2016-06-06 MED ORDER — ACETAMINOPHEN 160 MG/5ML PO SOLN: 650.0000 mg | ORAL | Status: DC | PRN

## 2016-06-06 MED ORDER — DIPHENHYDRAMINE HCL 50 MG/ML IJ SOLN
12.5000 mg | Freq: Once | INTRAMUSCULAR | Status: AC
Start: 2016-06-06 — End: 2016-06-06
  Administered 2016-06-06: 12.5 mg via INTRAVENOUS
  Filled 2016-06-06: qty 1

## 2016-06-06 MED ORDER — METHYLPREDNISOLONE SODIUM SUCC 125 MG IJ SOLR
125.0000 mg | Freq: Once | INTRAMUSCULAR | Status: AC
  Administered 2016-06-06: 125 mg via INTRAVENOUS
  Filled 2016-06-06: qty 2

## 2016-06-06 MED ORDER — ALTEPLASE (STROKE) FULL DOSE INFUSION
0.9000 mg/kg | Freq: Once | INTRAVENOUS | Status: AC
  Administered 2016-06-06: 72 mg via INTRAVENOUS
  Filled 2016-06-06: qty 100

## 2016-06-06 MED ORDER — SODIUM CHLORIDE 0.9 % IV SOLN
INTRAVENOUS | Status: DC
Start: 2016-06-06 — End: 2016-06-08
  Administered 2016-06-06 – 2016-06-07 (×2): via INTRAVENOUS

## 2016-06-06 MED ORDER — ENOXAPARIN SODIUM 40 MG/0.4ML ~~LOC~~ SOLN
40.0000 mg | SUBCUTANEOUS | Status: DC
  Administered 2016-06-06 – 2016-06-08 (×2): 40 mg via SUBCUTANEOUS
  Filled 2016-06-06 (×2): qty 0.4

## 2016-06-06 MED ORDER — ACETAMINOPHEN 325 MG PO TABS
650.0000 mg | ORAL_TABLET | ORAL | Status: DC | PRN
  Filled 2016-06-06: qty 2

## 2016-06-06 NOTE — ED Triage Notes (Addendum)
Pt arrived by [facility] from [facility]. Per staff, last seen normal at 0915 and then pt "went to sleep and unable to wake her." per ems, pt resistant and unable to complete neuro screening. On arrival, pt is unresponsive. Appears weak on right side, mild facial droop, eyes do not cross midline to right lateral side. Code stroke activated on arrival to ed.

## 2016-06-06 NOTE — ED Provider Notes (Signed)
[facility]-EMERGENCY DEPT Provider Note   CSN: [id number] Arrival date & time: 06/06/16  1004     History   Chief Complaint Chief Complaint  Patient presents with  . Code Stroke    HPI Jeanne Thomas is a 83 y.o. female.  The history is provided by the EMS personnel and the nursing home.   LEVEL V CAVEAT:  AMS, history obtained via EMS This is a 83 year old female with history of congestive heart failure, diabetes, GERD, hypertension, hypothyroidism, insomnia, presenting to the ED with altered mental status. Patient is a resident at [facility]. Staff there reports she laid down for nap at 9:15 and they were unable to awake her afterwards. They report this is not her baseline. Per EMS, patient has not been responsive with them. They did note that her right side seemed weak and somewhat flaccid and that her right eye was not moving normally but code stroke was not activated in the field.  No other history provided.  Patient not on anti-coagulation per MAR.  Past Medical History:  Diagnosis Date  . CHF (congestive heart failure) (HCC)   . Diabetes mellitus without complication (HCC)   . GERD (gastroesophageal reflux disease)   . Hypertension   . Hypothyroidism   . Insomnia     Patient Active Problem List   Diagnosis Date Noted  . Chronic diastolic heart failure (HCC) 04/29/2012  . HTN (hypertension) 04/29/2012  . Acute back pain 04/29/2012  . Obesity 04/29/2012  . DM type 2 (diabetes mellitus, type 2) (HCC) 04/29/2012  . GERD (gastroesophageal reflux disease) 04/29/2012  . Hypothyroid 04/29/2012  . Osteomyelitis of lumbar spine (HCC) 04/29/2012    History reviewed. No pertinent surgical history.  OB History    No data available       Home Medications    Prior to Admission medications   Medication Sig Start Date End Date Taking? Authorizing Provider  acetaminophen (TYLENOL) 325 MG tablet Take 650 mg by mouth 2 (two) times daily.    Historical Provider, MD    amLODipine (NORVASC) 5 MG tablet Take 5 mg by mouth daily.    Historical Provider, MD  bisacodyl (DULCOLAX) 5 MG EC tablet Take 5 mg by mouth daily as needed for mild constipation or moderate constipation.    Historical Provider, MD  Cholecalciferol (VITAMIN D3) 2000 UNITS TABS Take 2,000 mg by mouth daily.    Historical Provider, MD  docusate sodium (COLACE) 100 MG capsule Take 100 mg by mouth 3 (three) times daily.     Historical Provider, MD  furosemide (LASIX) 20 MG tablet Take 20 mg by mouth 2 (two) times daily.    Historical Provider, MD  guaiFENesin (ROBITUSSIN) 100 MG/5ML liquid Take 200 mg by mouth every 4 (four) hours as needed for cough.    Historical Provider, MD  levothyroxine (SYNTHROID, LEVOTHROID) 25 MCG tablet Take 12.5 mcg by mouth daily.     Historical Provider, MD  lisinopril (PRINIVIL,ZESTRIL) 2.5 MG tablet Take 2.5 mg by mouth daily.    Historical Provider, MD  loperamide (IMODIUM) 2 MG capsule Take 2 mg by mouth as needed for diarrhea or loose stools.    Historical Provider, MD  loratadine (CLARITIN) 10 MG tablet Take 10 mg by mouth daily.    Historical Provider, MD  meclizine (ANTIVERT) 12.5 MG tablet Take 12.5 mg by mouth 2 (two) times daily.    Historical Provider, MD  Melatonin 3 MG TABS Take 3 mg by mouth at bedtime.  Historical Provider, MD  metoprolol tartrate (LOPRESSOR) 25 MG tablet Take 12.5 mg by mouth 2 (two) times daily.    Historical Provider, MD  omeprazole (PRILOSEC) 20 MG capsule Take 20 mg by mouth daily.     Historical Provider, MD  oxyCODONE-acetaminophen (PERCOCET/ROXICET) 5-325 MG per tablet Take 1 tablet by mouth at bedtime. May take an additional tablet every 8 hours as needed for pain/restlessness    Historical Provider, MD  polyethylene glycol (MIRALAX / GLYCOLAX) packet Take 17 g by mouth daily.     Historical Provider, MD  potassium chloride SA (K-DUR,KLOR-CON) 20 MEQ tablet Take 20 mEq by mouth daily.    Historical Provider, MD  senna-docusate  (SENOKOT-S) 8.6-50 MG per tablet Take 1 tablet by mouth daily.    Historical Provider, MD  traMADol (ULTRAM) 50 MG tablet Take 1 tablet (50 mg total) by mouth every 12 (twelve) hours as needed. pain Patient taking differently: Take 50 mg by mouth 2 (two) times daily. pain 05/03/12   Clydia Llano, MD    Family History History reviewed. No pertinent family history.  Social History Social History  Substance Use Topics  . Smoking status: Never Smoker  . Smokeless tobacco: Never Used  . Alcohol use No     Allergies   Patient has no known allergies.   Review of Systems Review of Systems  Unable to perform ROS: Mental status change     Physical Exam Updated Vital Signs Wt 80.4 kg Comment: stroke bed  SpO2 97%   BMI 61.54 kg/m   Physical Exam  Constitutional: She appears well-developed and well-nourished.  HENT:  Head: Normocephalic and atraumatic.  Mouth/Throat: Oropharynx is clear and moist.  No visible signs of head trauma, no hemotympanum Tongue sticking out of left side of mouth, thick in appearance, no apparent lip or neck swelling, handling secretions well, airway patent  Eyes: Conjunctivae and EOM are normal. Pupils are equal, round, and reactive to light.  Right eye is not crossing midline; eyelid easily opened without resistance; left eye seems to be tracking normally, resistance of left eyelid when attempting to open  Neck: Normal range of motion.  Cardiovascular: Normal rate, regular rhythm and normal heart sounds.   Pulmonary/Chest: Effort normal and breath sounds normal.  Abdominal: Soft. Bowel sounds are normal.  Musculoskeletal: Normal range of motion.  Neurological:  Will open eyes to loud verbal stimuli, does not answer questions for follow commands; spontaneously moving both feet; will withdraw arms from pain bilaterally  Skin: Skin is warm and dry.  Psychiatric: She has a normal mood and affect.  Nursing note and vitals reviewed.    ED Treatments /  Results  Labs (all labs ordered are listed, but only abnormal results are displayed) Labs Reviewed  COMPREHENSIVE METABOLIC PANEL - Abnormal; Notable for the following:       Result Value   Potassium 5.4 (*)    Glucose, Bld 125 (*)    Albumin 3.2 (*)    All other components within normal limits  I-STAT CHEM 8, ED - Abnormal; Notable for the following:    Chloride 96 (*)    BUN 26 (*)    Glucose, Bld 163 (*)    All other components within normal limits  ETHANOL  PROTIME-INR  APTT  CBC  DIFFERENTIAL  RAPID URINE DRUG SCREEN, HOSP PERFORMED  URINALYSIS, ROUTINE W REFLEX MICROSCOPIC  I-STAT TROPOININ, ED    EKG  EKG Interpretation None       Radiology Dg Chest  Port 1 View  Result Date: 06/06/2016 CLINICAL DATA:  Right side weakness.  Altered mental status. EXAM: PORTABLE CHEST 1 VIEW COMPARISON:  10/02/2015 FINDINGS: Cardiomegaly with vascular congestion. No overt edema. No confluent opacities or effusions. No acute bony abnormality appear IMPRESSION: Cardiomegaly, vascular congestion. Electronically Signed   By: Charlett Nose M.D.   On: 06/06/2016 11:53   Ct Head Code Stroke W/o Cm  Result Date: 06/06/2016 CLINICAL DATA:  Code stroke.  Aphasia.  Last seen normal 9:15 a.m. EXAM: CT HEAD WITHOUT CONTRAST TECHNIQUE: Contiguous axial images were obtained from the base of the skull through the vertex without intravenous contrast. COMPARISON:  10/12/2015. FINDINGS: Brain: SUBTLE SIGNS OF ASYMMETRIC HYPOATTENUATION LEFT insula and LEFT anterior temporal lobe, concerning for early cytotoxic edema. Atrophy and small vessel disease. No hemorrhage, mass lesion, hydrocephalus, or extra-axial fluid. Vascular: Possible hyperdense LEFT MCA involving distal M1 segment, see image 10 series 2 and image 27 series 4. Skull: Normal. Negative for fracture or focal lesion. Sinuses/Orbits: No acute finding. Other: None. ASPECTS [facility] Stroke Program Early CT Score) - Ganglionic level infarction  (caudate, lentiform nuclei, internal capsule, insula, M1-M3 cortex): 5, due to suspected hypodensity of the insula and M2 cortex. - Supraganglionic infarction (M4-M6 cortex): 3 Total score (0-10 with 10 being normal): 8. Findings appear to represent interval change from priors. IMPRESSION: 1. Concern for hyperdense distal LEFT MCA M1 segment suggesting thrombus. Hypoattenuation LEFT insula and LEFT anterior temporal lobe, possible early infarction 2. ASPECTS is 8. These results were called by telephone at the time of interpretation on 06/06/2016 at 10:44 am to Dr. Grace Isaac, who verbally acknowledged these results. Electronically Signed   By: Elsie Stain M.D.   On: 06/06/2016 10:49    Procedures Procedures (including critical care time)  CRITICAL CARE Performed by: Garlon Hatchet   Total critical care time: 45 minutes  Critical care time was exclusive of separately billable procedures and treating other patients.  Critical care was necessary to treat or prevent imminent or life-threatening deterioration.  Critical care was time spent personally by me on the following activities: development of treatment plan with patient and/or surrogate as well as nursing, discussions with consultants, evaluation of patient's response to treatment, examination of patient, obtaining history from patient or surrogate, ordering and performing treatments and interventions, ordering and review of laboratory studies, ordering and review of radiographic studies, pulse oximetry and re-evaluation of patient's condition.   Medications Ordered in ED Medications - No data to display   Initial Impression / Assessment and Plan / ED Course  I have reviewed the triage vital signs and the nursing notes.  Pertinent labs & imaging results that were available during my care of the patient were reviewed by me and considered in my medical decision making (see chart for details).  83 year old female here with altered mental  status. She lives at a nursing facility and was laid down for a nap at 9:15 but was difficult to arouse after. EMS reported that her right side did seem weak, but code stroke was not activated in the field. On arrival patient is mostly unresponsive. She does arouse to very loud verbal stimuli. She spontaneously moving her feet but does not follow commands. She withdraws both arms from pain. Her right eye does not seem to be crossing midline and there is no resistance when lifting her right upper eyelid. Left eye is tracking normally and there is resistance with eyelid lifting. Code stroke was activated upon initial assessment. CT scan  with concern for left M1 thrombus. Patient's Lorin PicketScott son has arrived at the bedside and neurology has discussed options of TPA. Got the report that he is her power of attorney and she has voiced to him "to do everything to keep her life". He gave verbal consent to go ahead with TPA. Neurology will be admitting for ongoing care.  11:17 AM Patient was transported to CT.  Neurology with concerns over tongue swelling.  I have gone over to CT to re-evaluate.  Tongue has not changed in appearance from first evaluation on arrival.  question if this is baseline?  God son was brought over to CT and states her tongue does intermittently hang out of her mouth like this, however appears somewhat thicker than normal.  There is no apparent swelling of the roof or floor of the mouth.  Patient is not drooling, maintaining airway.  No hypoxia.  There are concerns over worsening swelling/airway compromise and rapid decompensation with tpa due to bleed or other complications, therefore neurology has re-dicsussed with family and decision made not to undergo treatment with tpa, they will make her comfort care at this time.  No intubation or CPR.  Patient does take lisinopril.  Family unsure how long she has been on this medication.  Will give steroids, benadryl, pepcid given questionable tongue swelling.   Will admit to medicine service.  Discussed with Dr. Maryfrances Bunnellanford-- will admit to inpatient service.  Final Clinical Impressions(s) / ED Diagnoses   Final diagnoses:  Cerebral infarction due to thrombosis of left middle cerebral artery [facility](HCC)    New Prescriptions New Prescriptions   No medications on file     Garlon HatchetLisa M Yeraldin Litzenberger, PA-C 06/06/16 1400    Alvira MondayErin Schlossman, MD 06/09/16 1037

## 2016-06-06 NOTE — ED Notes (Signed)
Report called and given to 77M

## 2016-06-06 NOTE — H&P (Deleted)
History and physical exam    Chief Complaint: Code Stroke  History obtained from:  EMS  HPI:                                                                                                                                         Jeanne Thomas is an 83 y.o. female is at a nursing facility. She was last seen normal at 0915. Patient went to bed to take a nap. Upon awakening patient was found to be extremely lethargic and not appearing to moving her right side as well. Pershe appeared weak on the right side, mild she'll droop. Arrival patient was lethargic followed no commands appeared to have a triple reflex on her right leg. It was discussed with family the possibility of giving TPA and the family agreed to go ahead and administer TPA. CT of head was done and a ganglionic level infarct including the caudate, lentiform nuclei, internal capsule, insula, M1-M3 cortex. His concern for a hypodense distal left MCA M1 segment suggesting a thrombus.    Date last known well: Date: 06/06/2016 Time last known well: Time: 09:15 tPA Given: yes after talking with family   Past Medical History:  Diagnosis Date  . CHF (congestive heart failure) (HCC)   . Diabetes mellitus without complication (HCC)   . GERD (gastroesophageal reflux disease)   . Hypertension   . Hypothyroidism   . Insomnia     History reviewed. No pertinent surgical history.  History reviewed. No pertinent family history. Social History:  reports that she has never smoked. She has never used smokeless tobacco. She reports that she does not drink alcohol or use drugs.  Allergies: No Known Allergies  Medications:                                                                                                                           Current Facility-Administered Medications  Medication Dose Route Frequency Provider Last Rate Last Dose  . alteplase (ACTIVASE) 1 mg/mL infusion 72 mg  0.9 mg/kg Intravenous Once Luan Pulling,  MD 72 mL/hr at 06/06/16 1111 72 mg at 06/06/16 1111   Followed by  . 0.9 %  sodium chloride infusion  50 mL Intravenous Once Luan Pulling, MD      . iopamidol (ISOVUE-370) 76 % injection           .  sodium chloride 0.9 % bolus 1,000 mL  1,000 mL Intravenous Once Luan Pulling, MD       Current Outpatient Prescriptions  Medication Sig Dispense Refill  . acetaminophen (TYLENOL) 325 MG tablet Take 650 mg by mouth 2 (two) times daily.    Marland Kitchen amLODipine (NORVASC) 5 MG tablet Take 5 mg by mouth daily.    . bisacodyl (DULCOLAX) 5 MG EC tablet Take 5 mg by mouth daily as needed for mild constipation or moderate constipation.    . Cholecalciferol (VITAMIN D3) 2000 UNITS TABS Take 2,000 mg by mouth daily.    Marland Kitchen docusate sodium (COLACE) 100 MG capsule Take 100 mg by mouth 3 (three) times daily.     . furosemide (LASIX) 20 MG tablet Take 20 mg by mouth 2 (two) times daily.    Marland Kitchen guaiFENesin (ROBITUSSIN) 100 MG/5ML liquid Take 200 mg by mouth every 4 (four) hours as needed for cough.    . Lidocaine (ASPERCREME LIDOCAINE) 4 % PTCH Apply 1 patch topically 2 (two) times daily.    Marland Kitchen lisinopril (PRINIVIL,ZESTRIL) 2.5 MG tablet Take 2.5 mg by mouth daily.    Marland Kitchen loperamide (IMODIUM) 2 MG capsule Take 2 mg by mouth as needed for diarrhea or loose stools.    Marland Kitchen loratadine (CLARITIN) 10 MG tablet Take 10 mg by mouth daily.    . meclizine (ANTIVERT) 12.5 MG tablet Take 12.5 mg by mouth 2 (two) times daily.    . metoprolol tartrate (LOPRESSOR) 25 MG tablet Take 12.5 mg by mouth 2 (two) times daily.    Marland Kitchen omeprazole (PRILOSEC) 20 MG capsule Take 20 mg by mouth daily.     Marland Kitchen oxyCODONE-acetaminophen (PERCOCET/ROXICET) 5-325 MG per tablet Take 1 tablet by mouth See admin instructions. Take 1 tablet by mouth at bedtime. May take an additional tablet every 8 hours as needed for pain/restlessness    . polyethylene glycol (MIRALAX / GLYCOLAX) packet Take 17 g by mouth daily.     . potassium chloride SA (K-DUR,KLOR-CON) 20 MEQ  tablet Take 20 mEq by mouth daily.    Marland Kitchen senna-docusate (SENOKOT-S) 8.6-50 MG per tablet Take 1 tablet by mouth daily.    . traMADol (ULTRAM) 50 MG tablet Take 1 tablet (50 mg total) by mouth every 12 (twelve) hours as needed. pain (Patient taking differently: Take 50 mg by mouth every 12 (twelve) hours. pain) 30 tablet 0     ROS:                                                                                                                                       History obtained from unobtainable from patient due to mental status    Neurologic Examination:  Weight 80.4 kg (177 lb 4 oz), SpO2 97 %.  HEENT-  Normocephalic, no lesions, without obvious abnormality.  Normal external eye and conjunctiva.  Normal TM's bilaterally.  Normal auditory canals and external ears. Normal external nose, mucus membranes and septum.  Normal pharynx. Cardiovascular- S1, S2 normal, pulses palpable throughout   Lungs- chest clear, no wheezing, rales, normal symmetric air entry Abdomen- normal findings: bowel sounds normal Extremities- no edema Lymph-no adenopathy palpable Musculoskeletal-no joint tenderness, deformity or swelling Skin-warm and dry, no hyperpigmentation, vitiligo, or suspicious lesions  Neurological Examination Mental Status: Irving Burton lethargic withdraws to pain and noxious stimuli follows no commands moving all extremities spontaneously but not to command. Nonfocal and appears to have a swollen tongue Cranial Nerves: II: Intact to threat bilaterally III,IV, VI: ptosis not present, extra-ocular motions intact bilaterally, pupils equal, round, reactive to light and accommodation V,VII: Face symmetric, facial light touch sensation normal bilaterally VIII: hearing normal bilaterally IX,X: uvula rises symmetrically XI: bilateral shoulder shrug XII: midline tongue extension Motor: Moving all  extremities antigravity however difficult to assess as she does not follow commands are weaker on the right than the left. Sensory: Is to pain in all extremities--is to have a triple reflex in the right lower extremity Deep Tendon Reflexes: Press throughout with no ankle jerk or Plantars: Right: Upgoing   Left: downgoing Cerebellar: Unable to obtain Gait: Able to obtain      Lab Results: Basic Metabolic Panel: No results for input(s): NA, K, CL, CO2, GLUCOSE, BUN, CREATININE, CALCIUM, MG, PHOS in the last 168 hours.  Liver Function Tests: No results for input(s): AST, ALT, ALKPHOS, BILITOT, PROT, ALBUMIN in the last 168 hours. No results for input(s): LIPASE, AMYLASE in the last 168 hours. No results for input(s): AMMONIA in the last 168 hours.  CBC: No results for input(s): WBC, NEUTROABS, HGB, HCT, MCV, PLT in the last 168 hours.  Cardiac Enzymes: No results for input(s): CKTOTAL, CKMB, CKMBINDEX, TROPONINI in the last 168 hours.  Lipid Panel: No results for input(s): CHOL, TRIG, HDL, CHOLHDL, VLDL, LDLCALC in the last 168 hours.  CBG: No results for input(s): GLUCAP in the last 168 hours.  Microbiology: Results for orders placed or performed during the hospital encounter of 04/29/12  Culture, blood (routine x 2)     Status: None   Collection Time: 04/30/12  3:05 PM  Result Value Ref Range Status   Specimen Description BLOOD RIGHT HAND  Final   Special Requests BOTTLES DRAWN AEROBIC AND ANAEROBIC 10CC  Final   Culture  Setup Time 04/30/2012 21:11  Final   Culture NO GROWTH 5 DAYS  Final   Report Status 05/06/2012 FINAL  Final  Culture, blood (routine x 2)     Status: None   Collection Time: 04/30/12  3:06 PM  Result Value Ref Range Status   Specimen Description BLOOD LEFT HAND  Final   Special Requests BOTTLES DRAWN AEROBIC AND ANAEROBIC 10CC  Final   Culture  Setup Time 04/30/2012 21:11  Final   Culture NO GROWTH 5 DAYS  Final   Report Status 05/06/2012 FINAL   Final  Culture, routine-abscess     Status: None   Collection Time: 05/01/12 11:51 AM  Result Value Ref Range Status   Specimen Description ABSCESS BACK  Final   Special Requests L2 3 DISC ASPIRATION IN IR  Final   Gram Stain   Final    RARE WBC PRESENT, PREDOMINANTLY PMN NO SQUAMOUS EPITHELIAL CELLS SEEN NO ORGANISMS SEEN  Culture NO GROWTH 3 DAYS  Final   Report Status 05/04/2012 FINAL  Final  AFB culture with smear     Status: None   Collection Time: 05/01/12 11:51 AM  Result Value Ref Range Status   Specimen Description ABSCESS L2 3 DISC ASPIRATION  Final   Special Requests NONE  Final   Acid Fast Smear NO ACID FAST BACILLI SEEN  Final   Culture NO ACID FAST BACILLI ISOLATED IN 6 WEEKS  Final   Report Status 06/13/2012 FINAL  Final  Fungus culture w smear     Status: None   Collection Time: 05/01/12 11:51 AM  Result Value Ref Range Status   Specimen Description ABSCESS L2 3 DISC ASPIRATION  Final   Special Requests NONE  Final   Fungal Smear NO YEAST OR FUNGAL ELEMENTS SEEN  Final   Culture No Fungi Isolated in 4 Weeks  Final   Report Status 05/29/2012 FINAL  Final  Clostridium Difficile by PCR     Status: None   Collection Time: 05/02/12  5:30 PM  Result Value Ref Range Status   Toxigenic C Difficile by pcr NEGATIVE NEGATIVE Final    Coagulation Studies: No results for input(s): LABPROT, INR in the last 72 hours.  Imaging: Ct Head Code Stroke W/o Cm  Result Date: 06/06/2016 CLINICAL DATA:  Code stroke.  Aphasia.  Last seen normal 9:15 a.m. EXAM: CT HEAD WITHOUT CONTRAST TECHNIQUE: Contiguous axial images were obtained from the base of the skull through the vertex without intravenous contrast. COMPARISON:  10/12/2015. FINDINGS: Brain: SUBTLE SIGNS OF ASYMMETRIC HYPOATTENUATION LEFT insula and LEFT anterior temporal lobe, concerning for early cytotoxic edema. Atrophy and small vessel disease. No hemorrhage, mass lesion, hydrocephalus, or extra-axial fluid. Vascular:  Possible hyperdense LEFT MCA involving distal M1 segment, see image 10 series 2 and image 27 series 4. Skull: Normal. Negative for fracture or focal lesion. Sinuses/Orbits: No acute finding. Other: None. ASPECTS [facility] Stroke Program Early CT Score) - Ganglionic level infarction (caudate, lentiform nuclei, internal capsule, insula, M1-M3 cortex): 5, due to suspected hypodensity of the insula and M2 cortex. - Supraganglionic infarction (M4-M6 cortex): 3 Total score (0-10 with 10 being normal): 8. Findings appear to represent interval change from priors. IMPRESSION: 1. Concern for hyperdense distal LEFT MCA M1 segment suggesting thrombus. Hypoattenuation LEFT insula and LEFT anterior temporal lobe, possible early infarction 2. ASPECTS is 8. These results were called by telephone at the time of interpretation on 06/06/2016 at 10:44 am to Dr. Grace Isaac, who verbally acknowledged these results. Electronically Signed   By: Elsie Stain M.D.   On: 06/06/2016 10:49       Assessment and plan discussed with with attending physician and they are in agreement.    Felicie Morn PA-C [facility] [phone]  06/06/2016, 11:08 AM   Assessment: 83 y.o. female with what appears to be sudden onset of right sided weakness with CT showing possible left M1 occlusion. Discussion with family has been made and family has decided to undergo TPA as she is still in the window.  Stroke Risk Factors - diabetes mellitus and hypertension   1. HgbA1c, fasting lipid panel 2. MRI, MRA  of the brain without contrast 3. PT consult, OT consult, Speech consult 4. Echocardiogram 5. Carotid dopplers 6. Prophylactic therapy-start aspirin on 06/07/2016 7. Risk factor modification 8. Telemetry monitoring 9. Frequent neuro checks 10 NPO until passes stroke swallow screen 11 please page stroke NP  Or  PA  Or MD from 8am -4  pm  as this patient from this time will be  followed by the stroke.   You can look them up on  [email or web address]  Password [email or web address]

## 2016-06-06 NOTE — Progress Notes (Signed)
MD notified potassium level. No new orders at this time.

## 2016-06-06 NOTE — Progress Notes (Signed)
Patient arrived to unit.  Accompanied by sister. Transferred 2 assist to bed.  Patient resting with eyes closed no distress noted.

## 2016-06-06 NOTE — Evaluation (Signed)
Clinical/Bedside Swallow Evaluation Patient Details  Name: Jeanne Thomas MRN: [id number] Date of Birth: 1917-06-22  Today's Date: 06/06/2016 Time: SLP Start Time (ACUTE ONLY): 1652 SLP Stop Time (ACUTE ONLY): 1702 SLP Time Calculation (min) (ACUTE ONLY): 10 min  Past Medical History:  Past Medical History:  Diagnosis Date  . CHF (congestive heart failure) (HCC)   . Diabetes mellitus without complication (HCC)   . GERD (gastroesophageal reflux disease)   . Hypertension   . Hypothyroidism   . Insomnia    Past Surgical History: History reviewed. No pertinent surgical history. HPI:  Pt is a 83 y.o.femalewith a past medical history significant for HTN, HFpEF, NIDDM, and hypothyroidismwho presents with unresponsiveness and right sided weakness. CT head showed a suspected MCA occlusion and early infarct. TPN was discussed with the family, and the POA(a godson who is a Engineer, civil (consulting)), and it was noted that the patient had some tongue swelling, initially thought to be angioedema from lisinopril, and so TPA was not administered, and conference with family and TRH were asked to evaluate for stroke. Per MD notes, POA would want to focus on comfort, dignity and regaining function if possible vs. invasive treatments. Seen today for swallowing evaluation.    Assessment / Plan / Recommendation Clinical Impression  Patient presents with severe risk for aspiration given current level of arousal, inability to follow commands, and diminished response to verbal and tactile stimulation. At rest, patient with lingual edema with tongue extending from left side of oral cavity. Full assessment of oral cavity was not performed as patient does not open mouth despite max verbal, tactile cues. Wet, audible breath sounds suggestive of aspiration of secretions. Presentation of PO trials not appropriate at this time given the above. SLP will follow to determine readiness for PO. Received order for cognitive-linguistic  evaluation to be completed when level of alertness improves.     Aspiration Risk  Severe aspiration risk;Risk for inadequate nutrition/hydration    Diet Recommendation NPO   Medication Administration: Via alternative means    Other  Recommendations Oral Care Recommendations: Oral care QID Other Recommendations: Have oral suction available   Follow up Recommendations Skilled Nursing facility      Frequency and Duration min 2x/week  2 weeks       Prognosis Prognosis for Safe Diet Advancement: Good      Swallow Study   General Date of Onset: 06/06/16 HPI: Pt is a 83 y.o.femalewith a past medical history significant for HTN, HFpEF, NIDDM, and hypothyroidismwho presents with unresponsiveness and right sided weakness. CT head showed a suspected MCA occlusion and early infarct. TPN was discussed with the family, and the POA(a godson who is a Engineer, civil (consulting)), and it was noted that the patient had some tongue swelling, initially thought to be angioedema from lisinopril, and so TPA was not administered, and conference with family and TRH were asked to evaluate for stroke. Per MD notes, POA would want to focus on comfort, dignity and regaining function if possible vs. invasive treatments. Seen today for swallowing evaluation.  Type of Study: Bedside Swallow Evaluation Previous Swallow Assessment: none per chart Diet Prior to this Study: NPO Temperature Spikes Noted: No Respiratory Status: Room air History of Recent Intubation: No Behavior/Cognition: Lethargic/Drowsy Oral Cavity Assessment: Other (comment);Edema (lingual edema; protruding from left side of oral cavity) Oral Care Completed by SLP: No Oral Cavity - Dentition: Other (Comment) (could not assess) Self-Feeding Abilities: Other (Comment) (unable to administer PO) Patient Positioning: Upright in bed Baseline Vocal Quality: Not observed  Volitional Cough: Cognitively unable to elicit Volitional Swallow: Unable to elicit     Oral/Motor/Sensory Function Overall Oral Motor/Sensory Function: Other (comment) (patient unable to follow commands for thorough assessment)   Ice Chips Ice chips: Not tested   Thin Liquid Thin Liquid: Not tested    Nectar Thick Nectar Thick Liquid: Not tested   Honey Thick Honey Thick Liquid: Not tested   Puree Puree: Not tested   Solid   GO  Jeanne Thomas, [place] CF-SLP Speech-Language Pathologist [phone] Solid: Not tested        Jeanne Thomas 06/06/2016,5:28 PM

## 2016-06-06 NOTE — H&P (Addendum)
History and Physical  Patient Name: Jeanne Thomas     ZOX:[id number]    DOB: Mar 12, 1918    DOA: 06/06/2016 PCP: Florentina Jenny, MD   Patient coming from: [facility]     Chief Complaint: Unresponsive, right-sided weakness  HPI: Jeanne Thomas is a 83 y.o. female with a past medical history significant for HTN, HFpEF, NIDDM, and hypothyroidism who presents with unresponsiveness and right sided weakness.  Caveat that the patient is unresponsive and all history is collected from EDP and family.  Evidently the patient was in her normal state of health (wheelchair bound, mentally sound, interactive), until today around 9:15 when she took a nap. Chronic 11 staff tried to wake her but she was lethargic reportedly, nonverbal, left facial droop, not moving the right side. She was transported to [facility] as CODE STROKE.  ED course: -HR 71, Respirations and pulse ox normal, BP 141/62 -Na 136, K 5.1, Cr 0.8, WBC 7.4K, Hgb 12.8 -Coags normal, troponin normal -CT head showed a suspected MCA occlusion and early infarct -TPN was discussed with the family, and the POA (a godson who is a Engineer, civil (consulting)), and it was noted that the patient had some tongue swelling, initially thought to be angioedema from lisinopril, and so TPA was not administered, and conference with family and TRH were asked to evaluate for stroke -In my discussion with sister, who reports being a co-POA, she confirms what is documented by Neurology that the godson said, that the patient wound not want intubation, CPR or other invasive treatments, but would want to focus on comfort, dignity and regaining function if possible -In discussion with Neurology PA, it was stated to me that the patient may certainly be able to recover from this point to be able to sit up in a wheelchair and participate in self-cares    Review of systems:  Review of Systems  Unable to perform ROS: Patient unresponsive         Past Medical History:  Diagnosis  Date  . CHF (congestive heart failure) (HCC)   . Diabetes mellitus without complication (HCC)   . GERD (gastroesophageal reflux disease)   . Hypertension   . Hypothyroidism   . Insomnia     History reviewed. No pertinent surgical history.  Social History: Patient lives at a nursing home.  Patient is wheelchair bound.  Her only son is deceased.  She has a godson and sister who are POAs.  She has minimal dementia.    No Known Allergies  Family history: family history includes Diabetes in her other and son; Hypertension in her other; Stroke in her son.  Prior to Admission medications   Medication Sig Start Date End Date Taking? Authorizing Provider  acetaminophen (TYLENOL) 325 MG tablet Take 650 mg by mouth 2 (two) times daily.   Yes Historical Provider, MD  amLODipine (NORVASC) 5 MG tablet Take 5 mg by mouth daily.   Yes Historical Provider, MD  bisacodyl (DULCOLAX) 5 MG EC tablet Take 5 mg by mouth daily as needed for mild constipation or moderate constipation.   Yes Historical Provider, MD  Cholecalciferol (VITAMIN D3) 2000 UNITS TABS Take 2,000 mg by mouth daily.   Yes Historical Provider, MD  docusate sodium (COLACE) 100 MG capsule Take 100 mg by mouth 3 (three) times daily.    Yes Historical Provider, MD  furosemide (LASIX) 20 MG tablet Take 20 mg by mouth 2 (two) times daily.   Yes Historical Provider, MD  guaiFENesin (ROBITUSSIN) 100 MG/5ML liquid  Take 200 mg by mouth every 4 (four) hours as needed for cough.   Yes Historical Provider, MD  Lidocaine (ASPERCREME LIDOCAINE) 4 % PTCH Apply 1 patch topically 2 (two) times daily.   Yes Historical Provider, MD  lisinopril (PRINIVIL,ZESTRIL) 2.5 MG tablet Take 2.5 mg by mouth daily.   Yes Historical Provider, MD  loperamide (IMODIUM) 2 MG capsule Take 2 mg by mouth as needed for diarrhea or loose stools.   Yes Historical Provider, MD  loratadine (CLARITIN) 10 MG tablet Take 10 mg by mouth daily.   Yes Historical Provider, MD  meclizine  (ANTIVERT) 12.5 MG tablet Take 12.5 mg by mouth 2 (two) times daily.   Yes Historical Provider, MD  metoprolol tartrate (LOPRESSOR) 25 MG tablet Take 12.5 mg by mouth 2 (two) times daily.   Yes Historical Provider, MD  omeprazole (PRILOSEC) 20 MG capsule Take 20 mg by mouth daily.    Yes Historical Provider, MD  oxyCODONE-acetaminophen (PERCOCET/ROXICET) 5-325 MG per tablet Take 1 tablet by mouth See admin instructions. Take 1 tablet by mouth at bedtime. May take an additional tablet every 8 hours as needed for pain/restlessness   Yes Historical Provider, MD  polyethylene glycol (MIRALAX / GLYCOLAX) packet Take 17 g by mouth daily.    Yes Historical Provider, MD  potassium chloride SA (K-DUR,KLOR-CON) 20 MEQ tablet Take 20 mEq by mouth daily.   Yes Historical Provider, MD  senna-docusate (SENOKOT-S) 8.6-50 MG per tablet Take 1 tablet by mouth daily.   Yes Historical Provider, MD  traMADol (ULTRAM) 50 MG tablet Take 1 tablet (50 mg total) by mouth every 12 (twelve) hours as needed. pain Patient taking differently: Take 50 mg by mouth every 12 (twelve) hours. pain 05/03/12  Yes Clydia Llano, MD     Physical Exam: BP 141/62 (BP Location: Left Leg)   Pulse 71   Resp 18   Wt 80.4 kg (177 lb 4 oz) Comment: stroke bed  SpO2 100%   BMI 61.54 kg/m  General appearance: Frail but normal weight elderly adult female, somnolent, snoring, opens eyse to sternal run only intermittently.  Does not withdraw from pain consistently.  Moves left arm spontaneously for me.  Does not follow commands.    Eyes: Anicteric, conjunctiva pink, lids and lashes normal. PERRL.    ENT: No nasal deformity, discharge, epistaxis.  Hearing unable to assess. Tongue dry.  Does not open mouth.  Tongue appears large and sister confirms that it is larger than usual. Lymph: No cervical, supraclavicular or axillary lymphadenopathy. Skin: Warm and dry.   No suspicious rashes or lesions. Cardiac: RRR, nl S1-S2, no murmurs appreciated.   Capillary refill is brisk.  JVP not visible.  No LE edema.  Radial and DP pulses 2+ and symmetric.  No carotid bruits. Respiratory: Normal respiratory rate and rhythm.  CTAB without rales or wheezes.  Snoring GI: Abdomen soft without rigidity.  No grimace to palpation. No ascites, distension, no hepatosplenomegaly.   MSK: No deformities or effusions. Neuro: Pupils are 4 mm and reactive to 3 mm, opens eyse to sternal run only intermittently.  Does not withdraw from pain consistently.  Moves left arm spontaneously for me.  Does not follow commands.  Right side hypoactive.  Does not respond to sternal rub from right side. Psych: Unable to assess.    Labs on Admission:  I have personally reviewed following labs and imaging studies: CBC:  Recent Labs Lab 06/06/16 1053 06/06/16 1103  WBC 7.4  --   NEUTROABS  3.9  --   HGB 12.8 15.0  HCT 38.9 44.0  MCV 95.8  --   PLT 223  --    Basic Metabolic Panel:  Recent Labs Lab 06/06/16 1103 06/06/16 1213  NA 136 135  K 5.1 5.4*  CL 96* 101  CO2  --  29  GLUCOSE 163* 125*  BUN 26* 16  CREATININE 0.80 0.69  CALCIUM  --  9.2   GFR: CrCl cannot be calculated (Unknown ideal weight.). Liver Function Tests:  Recent Labs Lab 06/06/16 1213  AST 37  ALT 16  ALKPHOS 54  BILITOT 0.5  PROT 6.5  ALBUMIN 3.2*   No results for input(s): LIPASE, AMYLASE in the last 168 hours. No results for input(s): AMMONIA in the last 168 hours. Coagulation Profile:  Recent Labs Lab 06/06/16 1053  INR 1.11   Cardiac Enzymes: No results for input(s): CKTOTAL, CKMB, CKMBINDEX, TROPONINI in the last 168 hours. BNP (last 3 results) No results for input(s): PROBNP in the last 8760 hours. HbA1C: No results for input(s): HGBA1C in the last 72 hours. CBG: No results for input(s): GLUCAP in the last 168 hours. Lipid Profile: No results for input(s): CHOL, HDL, LDLCALC, TRIG, CHOLHDL, LDLDIRECT in the last 72 hours. Thyroid Function Tests: No results for  input(s): TSH, T4TOTAL, FREET4, T3FREE, THYROIDAB in the last 72 hours. Anemia Panel: No results for input(s): VITAMINB12, FOLATE, FERRITIN, TIBC, IRON, RETICCTPCT in the last 72 hours. Sepsis Labs: Invalid input(s): PROCALCITONIN, LACTICIDVEN No results found for this or any previous visit (from the past 240 hour(s)).    Radiological Exams on Admission: Personally reviewed CXR shows no pneumonia, CT head report reviewed: Dg Chest Port 1 View  Result Date: 06/06/2016 CLINICAL DATA:  Right side weakness.  Altered mental status. EXAM: PORTABLE CHEST 1 VIEW COMPARISON:  10/02/2015 FINDINGS: Cardiomegaly with vascular congestion. No overt edema. No confluent opacities or effusions. No acute bony abnormality appear IMPRESSION: Cardiomegaly, vascular congestion. Electronically Signed   By: Charlett NoseKevin  Dover M.D.   On: 06/06/2016 11:53   Ct Head Code Stroke W/o Cm  Result Date: 06/06/2016 CLINICAL DATA:  Code stroke.  Aphasia.  Last seen normal 9:15 a.m. EXAM: CT HEAD WITHOUT CONTRAST TECHNIQUE: Contiguous axial images were obtained from the base of the skull through the vertex without intravenous contrast. COMPARISON:  10/12/2015. FINDINGS: Brain: SUBTLE SIGNS OF ASYMMETRIC HYPOATTENUATION LEFT insula and LEFT anterior temporal lobe, concerning for early cytotoxic edema. Atrophy and small vessel disease. No hemorrhage, mass lesion, hydrocephalus, or extra-axial fluid. Vascular: Possible hyperdense LEFT MCA involving distal M1 segment, see image 10 series 2 and image 27 series 4. Skull: Normal. Negative for fracture or focal lesion. Sinuses/Orbits: No acute finding. Other: None. ASPECTS [facility]([place] Stroke Program Early CT Score) - Ganglionic level infarction (caudate, lentiform nuclei, internal capsule, insula, M1-M3 cortex): 5, due to suspected hypodensity of the insula and M2 cortex. - Supraganglionic infarction (M4-M6 cortex): 3 Total score (0-10 with 10 being normal): 8. Findings appear to represent interval  change from priors. IMPRESSION: 1. Concern for hyperdense distal LEFT MCA M1 segment suggesting thrombus. Hypoattenuation LEFT insula and LEFT anterior temporal lobe, possible early infarction 2. ASPECTS is 8. These results were called by telephone at the time of interpretation on 06/06/2016 at 10:44 am to Dr. Grace IsaacQEER, who verbally acknowledged these results. Electronically Signed   By: Elsie StainJohn T Curnes M.D.   On: 06/06/2016 10:49     EKG: Independently reviewed. ECG rate 72, QTc 472, no ST changes.  Assessment/Plan  1. Acute Stroke:  This is new.  MRI pending.  -Admit to telemetry -Neuro checks, NIHSS per protocol -Daily aspirin 325 mg -Permissive hypertension for now -Lipids, hemoglobin A1c -MR/MRA of brain/head ordered  -Carotids and echo ordered -PT/OT/SLP consultation -Consult to Neurology, appreciate recommendations -Consult to Palliative Care re: goals of care   2. Angioedema: Has been on ACEi longterm.  Family seem clear that this is new, but seems odd this would suddenly occur at same time as stroke.   -Given uncertainty, will discontinue ACEi -Supportive cares  3. HTN:  -Permissive hypertension for now, hold lisinopril, metop -Restart metoprolol if or when able to take PO  4. Chronic diastolic CHF:  Euvolemic. -Restart furosemide if or when able to take PO  5. Hypothyroidism:  For now, unable to provide. -Restart if or when able to take PO  6. Other medications:  -Restart PPI, oxycodone, bowel regimen if or when able to take PO  7. Type 2 diabetes: No longer on medications. -SSI every 4 hours    DVT prophylaxis: Lovenox  Code Status: DO NOT RESUSCITATE  Family Communication: Sister at bedside  Disposition Plan: Anticipate Stroke work up as above and consult to ancillary services.  Expect discharge within 2-3 days. Consults called: Neurology, Dr. Grace Isaac has seen patient. Admission status: Telemetry, INPATIENT status  Core measures: -VTE prophylaxis  ordered at time of admission -Aspirin ordered at admission -Atrial fibrillation: not previously known, on telemetry monitoring -tPA not given because of family wishes, angioedema -Dysphagia screen ordered in ER -Lipids ordered -PT eval ordered    Medical decision making: Patient seen at 1:26 PM on 06/06/2016.  The patient was discussed with Sharilyn Sites, PA-C, D Smith, PA-C. What exists of the patient's chart was reviewed in depth and summarized above.  Clinical condition: stable at present.       Alberteen Sam [facility] Pager [phone]

## 2016-06-06 NOTE — Consult Note (Signed)
History and physical exam    Chief Complaint: Code Stroke  History obtained from:  EMS  HPI:                                                                                                                                         Jeanne Thomas is an 83 y.o. female is at a nursing facility. She was last seen normal at 0915. Patient went to bed to take a nap. Upon awakening patient was found to be extremely lethargic and not appearing to moving her right side as well. Pershe appeared weak on the right side, mild she'll droop. Arrival patient was lethargic followed no commands appeared to have a triple reflex on her right leg. It was discussed with family the possibility of giving TPA and the family agreed to go ahead and administer TPA. CT of head was done and a ganglionic level infarct including the caudate, lentiform nuclei, internal capsule, insula, M1-M3 cortex. His concern for a hypodense distal left MCA M1 segment suggesting a thrombus.    Date last known well: Date: 06/06/2016 Time last known well: Time: 09:15 tPA Given: Decision to give TPA was declined secondary to family deciding not to give TPA   Past Medical History:  Diagnosis Date  . CHF (congestive heart failure) (HCC)   . Diabetes mellitus without complication (HCC)   . GERD (gastroesophageal reflux disease)   . Hypertension   . Hypothyroidism   . Insomnia     History reviewed. No pertinent surgical history.  History reviewed. No pertinent family history. Social History:  reports that she has never smoked. She has never used smokeless tobacco. She reports that she does not drink alcohol or use drugs.  Allergies: No Known Allergies  Medications:                                                                                                                           Current Facility-Administered Medications  Medication Dose Route Frequency Provider Last Rate Last Dose  . 0.9 %  sodium chloride infusion  50 mL  Intravenous Once Luan Pulling, MD      . diphenhydrAMINE (BENADRYL) injection 12.5 mg  12.5 mg Intravenous Once Garlon Hatchet, PA-C      . famotidine (PEPCID) IVPB 20 mg premix  20 mg Intravenous STAT Garlon Hatchet, PA-C      .  iopamidol (ISOVUE-370) 76 % injection           . methylPREDNISolone sodium succinate (SOLU-MEDROL) 125 mg/2 mL injection 125 mg  125 mg Intravenous Once Garlon Hatchet, PA-C      . sodium chloride 0.9 % bolus 1,000 mL  1,000 mL Intravenous Once Luan Pulling, MD       Current Outpatient Prescriptions  Medication Sig Dispense Refill  . acetaminophen (TYLENOL) 325 MG tablet Take 650 mg by mouth 2 (two) times daily.    Marland Kitchen amLODipine (NORVASC) 5 MG tablet Take 5 mg by mouth daily.    . bisacodyl (DULCOLAX) 5 MG EC tablet Take 5 mg by mouth daily as needed for mild constipation or moderate constipation.    . Cholecalciferol (VITAMIN D3) 2000 UNITS TABS Take 2,000 mg by mouth daily.    Marland Kitchen docusate sodium (COLACE) 100 MG capsule Take 100 mg by mouth 3 (three) times daily.     . furosemide (LASIX) 20 MG tablet Take 20 mg by mouth 2 (two) times daily.    Marland Kitchen guaiFENesin (ROBITUSSIN) 100 MG/5ML liquid Take 200 mg by mouth every 4 (four) hours as needed for cough.    . Lidocaine (ASPERCREME LIDOCAINE) 4 % PTCH Apply 1 patch topically 2 (two) times daily.    Marland Kitchen lisinopril (PRINIVIL,ZESTRIL) 2.5 MG tablet Take 2.5 mg by mouth daily.    Marland Kitchen loperamide (IMODIUM) 2 MG capsule Take 2 mg by mouth as needed for diarrhea or loose stools.    Marland Kitchen loratadine (CLARITIN) 10 MG tablet Take 10 mg by mouth daily.    . meclizine (ANTIVERT) 12.5 MG tablet Take 12.5 mg by mouth 2 (two) times daily.    . metoprolol tartrate (LOPRESSOR) 25 MG tablet Take 12.5 mg by mouth 2 (two) times daily.    Marland Kitchen omeprazole (PRILOSEC) 20 MG capsule Take 20 mg by mouth daily.     Marland Kitchen oxyCODONE-acetaminophen (PERCOCET/ROXICET) 5-325 MG per tablet Take 1 tablet by mouth See admin instructions. Take 1 tablet by mouth at bedtime.  May take an additional tablet every 8 hours as needed for pain/restlessness    . polyethylene glycol (MIRALAX / GLYCOLAX) packet Take 17 g by mouth daily.     . potassium chloride SA (K-DUR,KLOR-CON) 20 MEQ tablet Take 20 mEq by mouth daily.    Marland Kitchen senna-docusate (SENOKOT-S) 8.6-50 MG per tablet Take 1 tablet by mouth daily.    . traMADol (ULTRAM) 50 MG tablet Take 1 tablet (50 mg total) by mouth every 12 (twelve) hours as needed. pain (Patient taking differently: Take 50 mg by mouth every 12 (twelve) hours. pain) 30 tablet 0     ROS:                                                                                                                                       History obtained from unobtainable from patient due to mental status  Neurologic Examination:                                                                                                      Blood pressure 141/62, pulse 71, resp. rate 18, weight 80.4 kg (177 lb 4 oz), SpO2 100 %.  HEENT-  Normocephalic, no lesions, without obvious abnormality.  Normal external eye and conjunctiva.  Normal TM's bilaterally.  Normal auditory canals and external ears. Normal external nose, mucus membranes and septum.  Normal pharynx. Cardiovascular- S1, S2 normal, pulses palpable throughout   Lungs- chest clear, no wheezing, rales, normal symmetric air entry Abdomen- normal findings: bowel sounds normal Extremities- no edema Lymph-no adenopathy palpable Musculoskeletal-no joint tenderness, deformity or swelling Skin-warm and dry, no hyperpigmentation, vitiligo, or suspicious lesions  Neurological Examination Mental Status: Irving Burton lethargic withdraws to pain and noxious stimuli follows no commands moving all extremities spontaneously but not to command. Nonfocal and appears to have a swollen tongue Cranial Nerves: II: Intact to threat bilaterally III,IV, VI: ptosis not present, extra-ocular motions intact bilaterally, pupils equal, round,  reactive to light and accommodation V,VII: Face symmetric, facial light touch sensation normal bilaterally VIII: hearing normal bilaterally IX,X: uvula rises symmetrically XI: bilateral shoulder shrug XII: midline tongue extension Motor: Moving all extremities antigravity however difficult to assess as she does not follow commands are weaker on the right than the left. Sensory: Is to pain in all extremities--is to have a triple reflex in the right lower extremity Deep Tendon Reflexes: Press throughout with no ankle jerk or Plantars: Right: Upgoing   Left: downgoing Cerebellar: Unable to obtain Gait: Able to obtain      Lab Results: Basic Metabolic Panel:  Recent Labs Lab 06/06/16 1103  NA 136  K 5.1  CL 96*  GLUCOSE 163*  BUN 26*  CREATININE 0.80    Liver Function Tests: No results for input(s): AST, ALT, ALKPHOS, BILITOT, PROT, ALBUMIN in the last 168 hours. No results for input(s): LIPASE, AMYLASE in the last 168 hours. No results for input(s): AMMONIA in the last 168 hours.  CBC:  Recent Labs Lab 06/06/16 1103  HGB 15.0  HCT 44.0    Cardiac Enzymes: No results for input(s): CKTOTAL, CKMB, CKMBINDEX, TROPONINI in the last 168 hours.  Lipid Panel: No results for input(s): CHOL, TRIG, HDL, CHOLHDL, VLDL, LDLCALC in the last 168 hours.  CBG: No results for input(s): GLUCAP in the last 168 hours.  Microbiology: Results for orders placed or performed during the hospital encounter of 04/29/12  Culture, blood (routine x 2)     Status: None   Collection Time: 04/30/12  3:05 PM  Result Value Ref Range Status   Specimen Description BLOOD RIGHT HAND  Final   Special Requests BOTTLES DRAWN AEROBIC AND ANAEROBIC 10CC  Final   Culture  Setup Time 04/30/2012 21:11  Final   Culture NO GROWTH 5 DAYS  Final   Report Status 05/06/2012 FINAL  Final  Culture, blood (routine x 2)     Status: None   Collection Time: 04/30/12  3:06 PM  Result Value Ref  Range Status    Specimen Description BLOOD LEFT HAND  Final   Special Requests BOTTLES DRAWN AEROBIC AND ANAEROBIC 10CC  Final   Culture  Setup Time 04/30/2012 21:11  Final   Culture NO GROWTH 5 DAYS  Final   Report Status 05/06/2012 FINAL  Final  Culture, routine-abscess     Status: None   Collection Time: 05/01/12 11:51 AM  Result Value Ref Range Status   Specimen Description ABSCESS BACK  Final   Special Requests L2 3 DISC ASPIRATION IN IR  Final   Gram Stain   Final    RARE WBC PRESENT, PREDOMINANTLY PMN NO SQUAMOUS EPITHELIAL CELLS SEEN NO ORGANISMS SEEN   Culture NO GROWTH 3 DAYS  Final   Report Status 05/04/2012 FINAL  Final  AFB culture with smear     Status: None   Collection Time: 05/01/12 11:51 AM  Result Value Ref Range Status   Specimen Description ABSCESS L2 3 DISC ASPIRATION  Final   Special Requests NONE  Final   Acid Fast Smear NO ACID FAST BACILLI SEEN  Final   Culture NO ACID FAST BACILLI ISOLATED IN 6 WEEKS  Final   Report Status 06/13/2012 FINAL  Final  Fungus culture w smear     Status: None   Collection Time: 05/01/12 11:51 AM  Result Value Ref Range Status   Specimen Description ABSCESS L2 3 DISC ASPIRATION  Final   Special Requests NONE  Final   Fungal Smear NO YEAST OR FUNGAL ELEMENTS SEEN  Final   Culture No Fungi Isolated in 4 Weeks  Final   Report Status 05/29/2012 FINAL  Final  Clostridium Difficile by PCR     Status: None   Collection Time: 05/02/12  5:30 PM  Result Value Ref Range Status   Toxigenic C Difficile by pcr NEGATIVE NEGATIVE Final    Coagulation Studies: No results for input(s): LABPROT, INR in the last 72 hours.  Imaging: Ct Head Code Stroke W/o Cm  Result Date: 06/06/2016 CLINICAL DATA:  Code stroke.  Aphasia.  Last seen normal 9:15 a.m. EXAM: CT HEAD WITHOUT CONTRAST TECHNIQUE: Contiguous axial images were obtained from the base of the skull through the vertex without intravenous contrast. COMPARISON:  10/12/2015. FINDINGS: Brain: SUBTLE SIGNS  OF ASYMMETRIC HYPOATTENUATION LEFT insula and LEFT anterior temporal lobe, concerning for early cytotoxic edema. Atrophy and small vessel disease. No hemorrhage, mass lesion, hydrocephalus, or extra-axial fluid. Vascular: Possible hyperdense LEFT MCA involving distal M1 segment, see image 10 series 2 and image 27 series 4. Skull: Normal. Negative for fracture or focal lesion. Sinuses/Orbits: No acute finding. Other: None. ASPECTS [facility]([place] Stroke Program Early CT Score) - Ganglionic level infarction (caudate, lentiform nuclei, internal capsule, insula, M1-M3 cortex): 5, due to suspected hypodensity of the insula and M2 cortex. - Supraganglionic infarction (M4-M6 cortex): 3 Total score (0-10 with 10 being normal): 8. Findings appear to represent interval change from priors. IMPRESSION: 1. Concern for hyperdense distal LEFT MCA M1 segment suggesting thrombus. Hypoattenuation LEFT insula and LEFT anterior temporal lobe, possible early infarction 2. ASPECTS is 8. These results were called by telephone at the time of interpretation on 06/06/2016 at 10:44 am to Dr. Grace IsaacQEER, who verbally acknowledged these results. Electronically Signed   By: Elsie StainJohn T Curnes M.D.   On: 06/06/2016 10:49       Assessment and plan discussed with with attending physician and they are in agreement.    Felicie MornDavid Saharra Santo PA-C [facility] [phone]  06/06/2016, 11:25 AM  Assessment: 83 y.o. female with what appears to be sudden onset of right sided weakness with CT showing possible left M1 occlusion. Discussion with family has been made and family has decided to not undergo TPA .  Stroke Risk Factors - diabetes mellitus and hypertension   1. HgbA1c, fasting lipid panel 2. MRI, MRA  of the brain without contrast 3. PT consult, OT consult, Speech consult 4. Echocardiogram 5. Carotid dopplers 6. Prophylactic therapy-start aspirin 25 mg daily after passed a swallow screen 7. Risk factor modification 8. Telemetry  monitoring 9. Frequent neuro checks 10 NPO until passes stroke swallow screen 11 please page stroke NP  Or  PA  Or MD from 8am -4 pm  as this patient from this time will be  followed by the stroke.   You can look them up on [email or web address]  Password [email or web address]

## 2016-06-06 NOTE — ED Notes (Signed)
MD neurologist at bedside to discuss goals of care with family Discussion concluded with No intubation, no compressions, no TPA because of angioedema based on family wishes. Palliative and comfort care will be administered if needed

## 2016-06-07 ENCOUNTER — Inpatient Hospital Stay (HOSPITAL_COMMUNITY): Payer: Medicare Other

## 2016-06-07 DIAGNOSIS — I63312 Cerebral infarction due to thrombosis of left middle cerebral artery: Principal | ICD-10-CM

## 2016-06-07 DIAGNOSIS — M7989 Other specified soft tissue disorders: Secondary | ICD-10-CM

## 2016-06-07 DIAGNOSIS — I6789 Other cerebrovascular disease: Secondary | ICD-10-CM

## 2016-06-07 DIAGNOSIS — I639 Cerebral infarction, unspecified: Secondary | ICD-10-CM

## 2016-06-07 LAB — GLUCOSE, CAPILLARY
GLUCOSE-CAPILLARY: 134 mg/dL — AB (ref 65–99)
GLUCOSE-CAPILLARY: 141 mg/dL — AB (ref 65–99)
GLUCOSE-CAPILLARY: 93 mg/dL (ref 65–99)
GLUCOSE-CAPILLARY: 98 mg/dL (ref 65–99)
Glucose-Capillary: 81 mg/dL (ref 65–99)
Glucose-Capillary: 69 mg/dL (ref 65–99)
Glucose-Capillary: 106 mg/dL — ABNORMAL HIGH (ref 65–99)

## 2016-06-07 LAB — LIPID PANEL
Cholesterol: 157 mg/dL (ref 0–200)
HDL: 64 mg/dL (ref >40)
LDL Cholesterol: 86 mg/dL (ref 0–99)
Total CHOL/HDL Ratio: 2.5 RATIO
Triglycerides: 36 mg/dL (ref <150)
VLDL: 7 mg/dL (ref 0–40)

## 2016-06-07 LAB — ECHOCARDIOGRAM COMPLETE: WEIGHTICAEL: 2836 [oz_av]

## 2016-06-07 MED ORDER — PERFLUTREN LIPID MICROSPHERE: 1.0000 mL | INTRAVENOUS | Status: DC | PRN

## 2016-06-07 MED ORDER — DEXTROSE 50 % IV SOLN
INTRAVENOUS | Status: AC
  Administered 2016-06-07: 50 mL
  Filled 2016-06-07: qty 50

## 2016-06-07 MED ORDER — PERFLUTREN LIPID MICROSPHERE
1.0000 mL | INTRAVENOUS | Status: AC | PRN
  Administered 2016-06-07: 2 mL via INTRAVENOUS
  Filled 2016-06-07: qty 10

## 2016-06-07 NOTE — Progress Notes (Signed)
  Echocardiogram 2D Echocardiogram with Definity has been performed.  Nolon RodBrown, Tony 06/07/2016, 10:10 AM

## 2016-06-07 NOTE — Evaluation (Signed)
Occupational Therapy Evaluation Patient Details Name: Jeanne Thomas MRN: [id number] DOB: 07/22/1917 Today's Date: 06/07/2016    History of Present Illness 83 y.o. [facility] for unresponsiveness and right sided weakness.  Pt dx with acute L MCA infarct (per MRI).  Pt with significant PMHx of HTN, DM, and CHF.   Clinical Impression   PTA, pt was residing in SNF. She is currently highly limited in ADL requiring total assistance at this time. She presents with no active movement of R UE and minimal purposeful use of L UE. Pt minimally able to follow commands at this time. No family present to determine baseline at this time and pt unable to report but per chart she was using a WC for functional mobility. Pt would benefit from continued OT services to address R UE functional use and improve independence with ADL and functional mobility. Recommend SNF placement post-acute D/C for continued rehabilitation.    Follow Up Recommendations  SNF;Supervision/Assistance - 24 hour    Equipment Recommendations  Wheelchair (measurements OT);Wheelchair cushion (measurements OT);Hospital bed [facility](Hoyer lift)    Recommendations for Other Services       Precautions / Restrictions Precautions Precautions: Fall Restrictions Weight Bearing Restrictions: No      Mobility Bed Mobility Overal bed mobility: Needs Assistance Bed Mobility: Rolling;Supine to Sit;Sit to Supine Rolling: Total assist;+2 for physical assistance   Supine to sit: +2 for physical assistance;Total assist;HOB elevated Sit to supine: +2 for physical assistance;Total assist   General bed mobility comments: Pt needed two person total assist to roll bil, once positioned on right side she was able to hold rail with left hand, but was unable to help in getting there.  Assist needed from maximally elevated HOB of both legs and trunk to get to sitting EOB and back to supine.    Transfers                 General  transfer comment: Unsafe to attempt at this time.    Balance Overall balance assessment: Needs assistance Sitting-balance support: Feet unsupported;Single extremity supported;No upper extremity supported Sitting balance-Leahy Scale: Poor Sitting balance - Comments: Max to heavy min assist.  Pt was able to support herself seated EOB max assist when first sitting up to heavy min assist once EOB for a few minutes.  Pt fatigued quickly and needed more support once fatigued.  Postural control: Posterior lean                                  ADL Overall ADL's : Needs assistance/impaired Eating/Feeding: NPO   Grooming: Total assistance;Bed level   Upper Body Bathing: Total assistance;Bed level   Lower Body Bathing: Total assistance;Bed level   Upper Body Dressing : Total assistance;Bed level   Lower Body Dressing: Total assistance;Bed level       Toileting- Clothing Manipulation and Hygiene: Total assistance;Bed level         General ADL Comments: Pt requiring total assist with all ADL at this time. Limited ability to follow commands. Head movements and moans throughout but unsure if these were voluntary as responses to questions.     Vision Vision Assessment?: Vision impaired- to be further tested in functional context Additional Comments: Pt not fully opening eyes during session. Able to hold midline head position.   Perception     Praxis      Pertinent Vitals/Pain Pain Assessment: Faces Faces  Pain Scale: No hurt     Hand Dominance Right   Extremity/Trunk Assessment Upper Extremity Assessment Upper Extremity Assessment: RUE deficits/detail;LUE deficits/detail RUE Deficits / Details: Slight subluxation of less than 1 finger. Little active movement. Spasticity noted in elbow flexion and extension. Second and third digits contracted lacking approximately 45 degrees of extension at PIP and 30 degrees DIP. LUE Deficits / Details: Limited ability to assess  strength due to cognitive deficits. Active movement of L hand and wrist. Pt unable to follow commands to move shoulder or elbow.   Lower Extremity Assessment Lower Extremity Assessment: Defer to PT evaluation RLE Deficits / Details: Sitting EOB as alert as she could be, pt moved bil LEs weakly against gravity by preforming small kicks and ankle movements bil.  We did not attempt standing as she is very short in stature and it might be difficult to get her back in the bed once standing without a stool.     Cervical / Trunk Assessment Cervical / Trunk Assessment: Kyphotic   Communication Communication Communication: Receptive difficulties;Expressive difficulties (Difficult to determine; pt not reponding verbally during)   Cognition Arousal/Alertness: Lethargic Behavior During Therapy: Flat affect Overall Cognitive Status: Impaired/Different from baseline Area of Impairment: Orientation;Attention;Memory;Following commands;Safety/judgement;Awareness;Problem solving Orientation Level: Disoriented to;Person;Place;Time;Situation Current Attention Level: Focused   Following Commands: Follows one step commands inconsistently Safety/Judgement: Decreased awareness of safety;Decreased awareness of deficits Awareness: Intellectual Problem Solving: Slow processing;Decreased initiation;Difficulty sequencing;Requires verbal cues;Requires tactile cues General Comments: Not sure of baseline, but pt needs redirection to task, wondering if she has both expressive and receptive difficulties.  No consistant command following.  Pt was unable to answer yes/no questions.  Some vocalization in the form of moans/groans that did not seem to be realted to pain.    General Comments       Exercises       Shoulder Instructions      Home Living Family/patient expects to be discharged to:: Skilled nursing facility (from [facility])                                 Additional Comments:  per chart pt was WC bound      Prior Functioning/Environment Level of Independence: Needs assistance        Comments: Pt unable to report but per chart lives at [facility].        OT Problem List: Decreased strength;Decreased range of motion;Decreased activity tolerance;Impaired balance (sitting and/or standing);Impaired vision/perception;Decreased coordination;Decreased cognition;Decreased safety awareness;Decreased knowledge of use of DME or AE;Decreased knowledge of precautions;Impaired sensation;Pain;Impaired UE functional use   OT Treatment/Interventions: Self-care/ADL training;Therapeutic exercise;Neuromuscular education;Energy conservation;DME and/or AE instruction;Therapeutic activities;Cognitive remediation/compensation;Visual/perceptual remediation/compensation;Patient/family education;Balance training    OT Goals(Current goals can be found in the care plan section) Acute Rehab OT Goals Patient Stated Goal: unable to state OT Goal Formulation: Patient unable to participate in goal setting Time For Goal Achievement: 06/21/16 Potential to Achieve Goals: Fair ADL Goals Pt Will Perform Grooming: sitting;with min assist Additional ADL Goal #1: Pt will complete bed mobility with minimal assistance in preparation for ADL. Additional ADL Goal #2: Pt/family will demonstrate independence with R UE PROM HEP in order to promote functional positioning and use of R hand.   OT Frequency: Min 2X/week   Barriers to D/C:            Co-evaluation PT/OT/SLP Co-Evaluation/Treatment: Yes Reason for Co-Treatment: Complexity of the patient's impairments (multi-system involvement);For patient/therapist safety;To  address functional/ADL transfers   OT goals addressed during session: ADL's and self-care      End of Session Nurse Communication: Mobility status  Activity Tolerance: Patient limited by lethargy Patient left: in bed;with call bell/phone within reach   Time: 1610-9604 OT Time  Calculation (min): 29 min Charges:  OT General Charges $OT Visit: 1 Procedure OT Evaluation $OT Eval Moderate Complexity: 1 Procedure G-CodesDoristine Section, OTR/L [phone] 06/07/2016, 4:57 PM

## 2016-06-07 NOTE — Evaluation (Signed)
Physical Therapy Evaluation Patient Details Name: Jeanne Thomas MRN: [id number] DOB: 04/05/1918 Today's Date: 06/07/2016   History of Present Illness  83 y.o. female [facility]/06/16 for unresponsiveness and right sided weakness.  Pt dx with acute L MCA infarct (per MRI).  Pt with significant PMHx of HTN, DM, and CHF.  Clinical Impression  Pt is very limited in her mobility, strength, and cognition.  There was no family present to report baseline, however, from chart reports she was WC bound at baseline [facility]  PT to follow acutely for continued mobility.     Follow Up Recommendations SNF;Supervision/Assistance - 24 hour    Equipment Recommendations  Wheelchair (measurements PT);Wheelchair cushion (measurements PT);Hospital bed;Other (comment) (hoyer lift)    Recommendations for Other Services   NA    Precautions / Restrictions Precautions Precautions: Fall Restrictions Weight Bearing Restrictions: No      Mobility  Bed Mobility Overal bed mobility: Needs Assistance Bed Mobility: Rolling;Supine to Sit;Sit to Supine Rolling: Total assist;+2 for physical assistance   Supine to sit: +2 for physical assistance;Total assist;HOB elevated Sit to supine: +2 for physical assistance;Total assist   General bed mobility comments: Pt needed two person total assist to roll bil, once positioned on right side she was able to hold rail with left hand, but was unable to help in getting there.  Assist needed from maximally elevated HOB of both legs and trunk to get to sitting EOB and back to supine.    Transfers                 General transfer comment: NT, would be safer with stool due to pt's short stature.       Modified Rankin (Stroke Patients Only) Modified Rankin (Stroke Patients Only) Pre-Morbid Rankin Score: Severe disability (per chart WC bound) Modified Rankin: Severe disability     Balance Overall balance assessment: Needs  assistance Sitting-balance support: Feet unsupported;Single extremity supported;No upper extremity supported Sitting balance-Leahy Scale: Poor Sitting balance - Comments: Max to heavy min assist.  Pt was able to support herself seated EOB max assist when first sitting up to heavy min assist once EOB for a few minutes.  Pt fatigued quickly and needed more support once fatigued.  Postural control: Posterior lean                                   Pertinent Vitals/Pain Pain Assessment: Faces Faces Pain Scale: No hurt    Home Living Family/patient expects to be discharged to:: Skilled nursing facility [facility]                 Additional Comments: per chart pt was WC bound    Prior Function Level of Independence: Needs assistance                  Extremity/Trunk Assessment   Upper Extremity Assessment Upper Extremity Assessment: Defer to OT evaluation    Lower Extremity Assessment Lower Extremity Assessment: RLE deficits/detail;LLE deficits/detail RLE Deficits / Details: Sitting EOB as alert as she could be, pt moved bil LEs weakly against gravity by preforming small kicks and ankle movements bil.  We did not attempt standing as she is very short in stature and it might be difficult to get her back in the bed once standing without a stool.      Cervical / Trunk Assessment Cervical /  Trunk Assessment: Kyphotic  Communication   Communication: Expressive difficulties;Receptive difficulties;Other (comment) (difficult to tell see SLP notes)  Cognition Arousal/Alertness: Lethargic Behavior During Therapy: Flat affect Overall Cognitive Status: Impaired/Different from baseline Area of Impairment: Orientation;Attention;Memory;Following commands;Safety/judgement;Awareness;Problem solving Orientation Level: Disoriented to;Person;Place;Time;Situation Current Attention Level: Focused   Following Commands: Follows one step commands  inconsistently Safety/Judgement: Decreased awareness of safety;Decreased awareness of deficits Awareness: Intellectual Problem Solving: Slow processing;Decreased initiation;Difficulty sequencing;Requires verbal cues;Requires tactile cues General Comments: Not sure of baseline, but pt needs redirection to task, wondering if she has both expressive and receptive difficulties.  No consistant command following.  Pt was unable to answer yes/no questions.  Some vocalization in the form of moans/groans that did not seem to be realted to pain.            Assessment/Plan    PT Assessment Patient needs continued PT services  PT Problem List Decreased strength;Decreased range of motion;Decreased activity tolerance;Decreased balance;Decreased mobility;Decreased coordination;Decreased cognition;Decreased knowledge of use of DME;Decreased safety awareness;Decreased knowledge of precautions;Impaired sensation;Impaired tone;Obesity          PT Treatment Interventions DME instruction;Functional mobility training;Therapeutic activities;Therapeutic exercise;Balance training;Neuromuscular re-education;Cognitive remediation;Patient/family education;Manual techniques;Wheelchair mobility training    PT Goals (Current goals can be found in the Care Plan section)  Acute Rehab PT Goals Patient Stated Goal: unable to state PT Goal Formulation: Patient unable to participate in goal setting Time For Goal Achievement: 06/21/16 Potential to Achieve Goals: Good    Frequency Min 3X/week        Co-evaluation   Reason for Co-Treatment: Complexity of the patient's impairments (multi-system involvement);For patient/therapist safety;To address functional/ADL transfers   OT goals addressed during session: ADL's and self-care       End of Session   Activity Tolerance: Patient limited by fatigue Patient left: in bed;with call bell/phone within reach;with bed alarm set Nurse Communication: Other (comment) (to RN  tech urine/BM)         Time: 6045-4098 PT Time Calculation (min) (ACUTE ONLY): 28 min   Charges:   PT Evaluation $PT Eval Moderate Complexity: 1 Procedure          Deajah Erkkila B. Bellah Alia, PT, DPT [phone]   06/07/2016, 4:37 PM

## 2016-06-07 NOTE — Progress Notes (Signed)
STROKE TEAM PROGRESS NOTE   HISTORY OF PRESENT ILLNESS (per record) Jeanne Thomas is an 83 y.o. female is at a nursing facility. She was last seen normal at 0915 On 06/06/2016. Patient went to bed to take a nap. Upon awakening patient was found to be extremely lethargic and not appearing to moving her right side as well. She appeared weak on the right side, mild facial droop. On arrival patient was lethargic followed no commands appeared to have a triple reflex on her right leg. It was discussed with family the possibility of giving TPA and the family agreed to go ahead and administer TPA. CT of head was done and a ganglionic level infarct ( including the caudate, lentiform nuclei, internal capsule, insula, M1-M3 cortex). Radiology concern for a hypodense distal left MCA M1 segment thrombus. Patient was ordered IV t-PA. It was discontinued (either with no tPA or after the bolus) due to angioedema (?pre-existing). She was admitted for further evaluation and treatment.   SUBJECTIVE (INTERVAL HISTORY) Son and granddaughter at bedside. Patient obtunded, with left gaze preference, aphasia and right-sided hemiplegia. Discussed with son and granddaughter at bedside, and also sister over the phone, regarding current diagnosis, treatment options and poor prognosis. They requested palliative care consult and stated that patient would not want any aggressive interventions in the situation of no meaningful recovery.   OBJECTIVE Temp:  [98.3 F (36.8 C)-99.4 F (37.4 C)] 98.7 F (37.1 C) (02/12 1418) Pulse Rate:  [80-96] 80 (02/12 1418) Cardiac Rhythm: Normal sinus rhythm (02/12 1200) Resp:  [18-22] 20 (02/12 1418) BP: (133-166)/(53-79) 153/74 (02/12 1418) SpO2:  [96 %-99 %] 99 % (02/12 1418)  CBC:  Recent Labs Lab 06/06/16 1053 06/06/16 1103  WBC 7.4  --   NEUTROABS 3.9  --   HGB 12.8 15.0  HCT 38.9 44.0  MCV 95.8  --   PLT 223  --     Basic Metabolic Panel:  Recent Labs Lab 06/06/16 1103  06/06/16 1213  NA 136 135  K 5.1 5.4*  CL 96* 101  CO2  --  29  GLUCOSE 163* 125*  BUN 26* 16  CREATININE 0.80 0.69  CALCIUM  --  9.2    Lipid Panel:    Component Value Date/Time   CHOL 157 06/07/2016 0340   TRIG 36 06/07/2016 0340   HDL 64 06/07/2016 0340   CHOLHDL 2.5 06/07/2016 0340   VLDL 7 06/07/2016 0340   LDLCALC 86 06/07/2016 0340   HgbA1c: No results found for: HGBA1C Urine Drug Screen: No results found for: LABOPIA, COCAINSCRNUR, LABBENZ, AMPHETMU, THCU, LABBARB    IMAGING I have personally reviewed the radiological images below and agree with the radiology interpretations.  Ct Head Code Stroke W/o Cm 06/06/2016 1. Concern for hyperdense distal LEFT MCA M1 segment suggesting thrombus. Hypoattenuation LEFT insula and LEFT anterior temporal lobe, possible early infarction 2. ASPECTS is 8.   Mr Brain 64Wo Contrast Mr Maxine GlennMra Head/brain Wo Cm 06/07/2016 Acute infarct left MCA territory. Probable small amount of hemorrhage in the left lateral putamen. Moderate chronic microvascular ischemic changes. Occlusion of the left M1 segment, probable embolus. No other significant intracranial stenosis.   Dg Chest Port 1 View 06/06/2016 Cardiomegaly, vascular congestion.   Carotid artery duplex  Findings are consistent with a 1-39 percent stenosis involving the left internal carotid artery. Unable to accurately measure stenosis of the right internal carotid artery due to non-visualization. However, plaque formations noted in the visualized portions of the right carotid system  were not of a severe nature. The vertebral arteries demonstrate antegrade flow.  Bilateral lower extremity venous duplex  There is no obvious evidence of deep or superficial vein thrombosis involving the right and left lower extremities. All clearly visualized vessels appear patent and compressible. There is no evidence of Baker's cysts bilaterally. PHYSICAL EXAM  2-D echocardiogram - Left ventricle: The  cavity size was normal. There was severe focal basal hypertrophy. Systolic function was vigorous. The estimated ejection fraction was in the range of 65% to 70%. Wall motion was normal; there were no regional wall motion abnormalities. Features are consistent with a pseudonormal left ventricular filling pattern, with concomitant abnormal relaxation and increased filling pressure (grade 2 diastolic dysfunction). Doppler parameters are consistent with high ventricular filling pressure. - Aortic valve: Trileaflet; mildly thickened, mildly calcified leaflets. - Mitral valve: Severely calcified annulus. - Left atrium: The atrium was moderately dilated.  Physical exam  Temp:  [98.3 F (36.8 C)-99.4 F (37.4 C)] 98.7 F (37.1 C) (02/12 1418) Pulse Rate:  [80-96] 80 (02/12 1418) Resp:  [18-22] 20 (02/12 1418) BP: (133-166)/(53-74) 153/74 (02/12 1418) SpO2:  [96 %-99 %] 99 % (02/12 1418)  General - Well nourished, well developed, obtunded with snoring  breathing.  Ophthalmologic - Fundi not visualized due to noncooperation.  Cardiovascular - Regular rate and rhythm.  Neuro - obtunded, not open eyes on voice, but slightly open eyes on pain stimulation. Not following commands, nonverbal. PERRL, left gaze preference, not crossing midline, right-sided neglect. Right facial droop, mild angioedema of the tongue. On pain stimulation, LUE purposeful movement against gravity , LLE withdraw 3/5. RUE 0/5 and RLE mild withdraw on pain sedation. DTR 1+, no Babinski. Sensation, coordination, gait not tested.    ASSESSMENT/PLAN Jeanne Thomas is a 83 y.o. female with history of HTN, CHF, DM and hypothyroidism presenting with Unresponsiveness and R sided weakness. She did not receive IV t-PA after family discussion.   Stroke:  left MCA territory infarct in setting of L M1 occlusion with resultant small L putamen hemorrhage. Infarct embolic secondary to unknown source.  Code stroke CT hyperdense left MCA  M1 suggesting thrombus. Aspects 8   MRI  acute left MCA territory infarct. Small amount hemorrhage left lateral putamen. Occlusion left M1 segment, probable embolus.  Carotid Doppler  no significant stenosis  Lower extremity Dopplers - No VTE   2D Echo  EF 65-70% with no source of embolus  LDL 86  HgbA1c pending  Lovenox 40 mg sq daily for VTE prophylaxis  Diet NPO time specified  No antithrombotic prior to admission, now on aspirin 300 mg suppository daily.   Family do not feel as if patient would want lifesaving measures. She would want to die with dignity. She is now a DO NOT RESUSCITATE. Palliative care consulted to see this patient  Therapy recommendations:  pending   Disposition:  pending   Hypertension  Stable Permissive hypertension (OK if < 220/120) but gradually normalize in 5-7 days Long-term BP goal normotensive  Hyperlipidemia  Home meds:  No statin  LDL 86, goal < 70  Consider statin at discharge depending on plan of care   Diabetes type II  HgbA1c pending, goal < 7.0  Other Stroke Risk Factors  Advanced age  Obesity  Chronic diastolic CHF  Other Active Problems  Angioedema  Hypothyroidism  Hospital day # 1  Discussed with son and granddaughter at bedside, and also sister over the phone, about current diagnosis, treatment options and poor prognosis.  They requested palliative care consult and stated that patient would not want any aggressive interventions in the situation of no or low quality of life. CODE STATUS DO NOT RESUSCITATE and DO NOT INTUBATE.  Neurology will sign off. Please call with questions. No neuro follow-up needed at this time Thanks for the consult.  Marvel Plan, MD PhD Stroke Neurology 06/07/2016 7:00 PM   To contact Stroke Continuity provider, please refer to [email or web address]. After hours, contact General Neurology

## 2016-06-07 NOTE — Progress Notes (Signed)
OT Cancellation Note  Patient Details Name: Jeanne Thomas MRN: [id number] DOB: 01/07/1918   Cancelled Treatment:    Reason Eval/Treat Not Completed: Patient not medically ready. Pt currently with bed rest orders. Will need increased activity orders to proceed with OT evaluation. OT will check back as appropriate.  [address]Venna Berberich A Dimitrios Balestrieri, OTR/L [phone] 06/07/2016, 7:49 AM

## 2016-06-07 NOTE — Care Management Note (Signed)
Case Management Note  Patient Details  Name: Jeanne Thomas MRN: [id number] Date of Birth: 06/13/1917  Subjective/Objective:            Patient was admitted for CVA. [facility]. CM will follow for discharge needs pending patient's progress and physician orders.         Action/Plan:   Expected Discharge Date:                  Expected Discharge Plan:     In-House Referral:     Discharge planning Services     Post Acute Care Choice:    Choice offered to:     DME Arranged:    DME Agency:     HH Arranged:    HH Agency:     Status of Service:     If discussed at [employer] Length of Stay Meetings, dates discussed:    Additional Comments:  Anda KraftRobarge, Jeanne Shindler Thomas, Jeanne Thomas 06/07/2016, 4:05 PM

## 2016-06-07 NOTE — Progress Notes (Addendum)
[facility] PROGRESS NOTE  Jeanne Thomas ZOX:[id number] DOB: March 14, 1918 DOA: 06/06/2016   PCP: Florentina Jenny, MD     Assessment/Plan: Principal Problem:   CVA [facility] accident[facility]) Active Problems:   Chronic diastolic heart failure (HCC)   Essential hypertension   Type 2 diabetes mellitus without complication, without long-term current use of insulin (HCC)   Hypothyroidism, acquired   Tongue swelling   Cerebral infarction due to thrombosis of left middle cerebral artery (HCC)  Jeanne Thomas is a 83 y.o. female with a past medical history significant for HTN, HFpEF, NIDDM, and hypothyroidism who presents with unresponsiveness and right sided weakness.She was transported to [facility] as CODE STROKE.patient had some tongue swelling, initially thought to be angioedema from lisinopril, and so TPA was not administered,patient wound not want intubation, CPR or other invasive treatments, but would want to focus on comfort, dignity and regaining function if possible  Assessment and plan 1. Acute Stroke:   MRI  Acute infarct left MCA territory Continue telemetry -Neuro checks, NIHSS per protocol -Daily aspirin 325 mg -Permissive hypertension for now LDL 86, hemoglobin A1c -MR/MRA of brain/head ordered  -Carotids and echo  consistent with a 1-39 percent stenosis involving the left internal carotid artery  no obvious evidence of deep or superficial vein thrombosis involving the right and left lower extremities -PT/OT/SLP consultation -Consult to Neurology, appreciate recommendations -Consult to Palliative Care re: goals of care Overall prognosis appears to be guarded  2. Angioedema: Has been on ACEi longterm.  Family seem clear that this is new, but seems odd this would suddenly occur at same time as stroke.   -Given uncertainty, will discontinue ACEi    3. HTN:  -Permissive hypertension for now, hold lisinopril, metop -Restart metoprolol if or when able to  take PO  4. Chronic diastolic CHF:  Euvolemic. -Restart furosemide if or when able to take PO  5. Hypothyroidism:  For now, unable to provide. -Restart if or when able to take PO  6. Other medications:  -Restart PPI, oxycodone, bowel regimen if or when able to take PO  7. Type 2 diabetes: No longer on medications. -SSI every 4 hours Hemoglobin A1c pending    DVT prophylaxsis Lovenox  Code Status:  DO NOT RESUSCITATE    Family Communication: Discussed in detail with the patient/son/granddaughter, all imaging results, lab results explained to the patient   Disposition Plan:  Anticipate discharge 2/13 IF  her workup is complete      Consultants:  Neurology  Procedures:  None  Antibiotics: Anti-infectives    None         HPI/Subjective: Patient unarousable, morning but no intelligible speech  Objective: Vitals:   06/06/16 2330 06/07/16 0118 06/07/16 0330 06/07/16 0537  BP: (!) 166/68 (!) 146/70 (!) 152/74 (!) 133/53  Pulse: 96 80 88   Resp: 20 20 18 20   Temp:  99.4 F (37.4 C)  98.3 F (36.8 C)  TempSrc:  Axillary  Axillary  SpO2:  98%  99%  Weight:       No intake or output data in the 24 hours ending 06/07/16 0815  Exam:  Cardiac: RRR, nl S1-S2, no murmurs appreciated. Capillary refill is brisk.  JVP not visible.  No LE edema.  Radial and DP pulses 2+ and symmetric.  No carotid bruits. Respiratory: Normal respiratory rate and rhythm.  CTAB without rales or wheezes.  Snoring GI: Abdomen soft without rigidity.  No grimace to palpation.No ascites,  distension, no hepatosplenomegaly.   MSK: No deformities or effusions. Neuro: Pupils are 4 mm and reactive to 3 mm, opens eyse to sternal run only intermittently.  Does not withdraw from pain consistently.  Moves left arm spontaneously for me.  Does not follow commands.  Right side hypoactive    Data Reviewed: I have personally reviewed following labs and imaging studies  Micro Results No  results found for this or any previous visit (from the past 240 hour(s)).  Radiology Reports Mr Brain Wo Contrast  Result Date: 06/07/2016 CLINICAL DATA:  Acute ischemic stroke. EXAM: MRI HEAD WITHOUT CONTRAST MRA HEAD WITHOUT CONTRAST TECHNIQUE: Multiplanar, multiecho pulse sequences of the brain and surrounding structures were obtained without intravenous contrast. Angiographic images of the head were obtained using MRA technique without contrast. COMPARISON:  CT head 06/06/2016 FINDINGS: MRI HEAD FINDINGS Brain: Acute left MCA infarct involving the temporal lobe, insular cortex, and lateral putamen. Small amount of acute infarct in the left frontal parietal operculum. No other acute infarct identified. Possible small amount of hemorrhage in the left lateral putamen. Ventricle size normal. Cerebral volume normal for age. Negative for mass lesion. Moderate chronic microvascular ischemic change in the white matter, pons, and thalamus bilaterally. Vascular: Circle Willis patent. Decreased flow void left M1 branch suggesting occlusion. Skull and upper cervical spine: Negative Sinuses/Orbits: Mild mucosal edema in the paranasal sinuses. Bilateral mastoid effusion. Bilateral lens replacement. No orbital mass lesion. Other: None MRA HEAD FINDINGS Right vertebral dominant. Both vertebral arteries patent to the basilar. PICA patent bilaterally. Basilar widely patent. Superior cerebellar and posterior cerebral arteries widely patent. Left cavernous carotid patent with mild atherosclerotic disease. Left anterior cerebral artery widely patent. Abrupt occlusion left M1 segment. Decreased flow in left MCA branches which do not show flow related signal. Right cavernous carotid widely patent. Right anterior and middle cerebral arteries widely patent. IMPRESSION: Acute infarct left MCA territory. Probable small amount of hemorrhage in the left lateral putamen. Moderate chronic microvascular ischemic changes. Occlusion of the  left M1 segment, probable embolus. No other significant intracranial stenosis. Electronically Signed   By: Marlan Palau M.D.   On: 06/07/2016 07:17   Dg Chest Port 1 View  Result Date: 06/06/2016 CLINICAL DATA:  Right side weakness.  Altered mental status. EXAM: PORTABLE CHEST 1 VIEW COMPARISON:  10/02/2015 FINDINGS: Cardiomegaly with vascular congestion. No overt edema. No confluent opacities or effusions. No acute bony abnormality appear IMPRESSION: Cardiomegaly, vascular congestion. Electronically Signed   By: Charlett Nose M.D.   On: 06/06/2016 11:53   Mr Maxine Glenn Head/brain ZO Cm  Result Date: 06/07/2016 CLINICAL DATA:  Acute ischemic stroke. EXAM: MRI HEAD WITHOUT CONTRAST MRA HEAD WITHOUT CONTRAST TECHNIQUE: Multiplanar, multiecho pulse sequences of the brain and surrounding structures were obtained without intravenous contrast. Angiographic images of the head were obtained using MRA technique without contrast. COMPARISON:  CT head 06/06/2016 FINDINGS: MRI HEAD FINDINGS Brain: Acute left MCA infarct involving the temporal lobe, insular cortex, and lateral putamen. Small amount of acute infarct in the left frontal parietal operculum. No other acute infarct identified. Possible small amount of hemorrhage in the left lateral putamen. Ventricle size normal. Cerebral volume normal for age. Negative for mass lesion. Moderate chronic microvascular ischemic change in the white matter, pons, and thalamus bilaterally. Vascular: Circle Willis patent. Decreased flow void left M1 branch suggesting occlusion. Skull and upper cervical spine: Negative Sinuses/Orbits: Mild mucosal edema in the paranasal sinuses. Bilateral mastoid effusion. Bilateral lens replacement. No orbital mass lesion. Other: None  MRA HEAD FINDINGS Right vertebral dominant. Both vertebral arteries patent to the basilar. PICA patent bilaterally. Basilar widely patent. Superior cerebellar and posterior cerebral arteries widely patent. Left cavernous  carotid patent with mild atherosclerotic disease. Left anterior cerebral artery widely patent. Abrupt occlusion left M1 segment. Decreased flow in left MCA branches which do not show flow related signal. Right cavernous carotid widely patent. Right anterior and middle cerebral arteries widely patent. IMPRESSION: Acute infarct left MCA territory. Probable small amount of hemorrhage in the left lateral putamen. Moderate chronic microvascular ischemic changes. Occlusion of the left M1 segment, probable embolus. No other significant intracranial stenosis. Electronically Signed   By: Marlan Palau M.D.   On: 06/07/2016 07:17   Ct Head Code Stroke W/o Cm  Result Date: 06/06/2016 CLINICAL DATA:  Code stroke.  Aphasia.  Last seen normal 9:15 a.m. EXAM: CT HEAD WITHOUT CONTRAST TECHNIQUE: Contiguous axial images were obtained from the base of the skull through the vertex without intravenous contrast. COMPARISON:  10/12/2015. FINDINGS: Brain: SUBTLE SIGNS OF ASYMMETRIC HYPOATTENUATION LEFT insula and LEFT anterior temporal lobe, concerning for early cytotoxic edema. Atrophy and small vessel disease. No hemorrhage, mass lesion, hydrocephalus, or extra-axial fluid. Vascular: Possible hyperdense LEFT MCA involving distal M1 segment, see image 10 series 2 and image 27 series 4. Skull: Normal. Negative for fracture or focal lesion. Sinuses/Orbits: No acute finding. Other: None. ASPECTS [facility] Stroke Program Early CT Score) - Ganglionic level infarction (caudate, lentiform nuclei, internal capsule, insula, M1-M3 cortex): 5, due to suspected hypodensity of the insula and M2 cortex. - Supraganglionic infarction (M4-M6 cortex): 3 Total score (0-10 with 10 being normal): 8. Findings appear to represent interval change from priors. IMPRESSION: 1. Concern for hyperdense distal LEFT MCA M1 segment suggesting thrombus. Hypoattenuation LEFT insula and LEFT anterior temporal lobe, possible early infarction 2. ASPECTS is 8. These  results were called by telephone at the time of interpretation on 06/06/2016 at 10:44 am to Dr. Grace Isaac, who verbally acknowledged these results. Electronically Signed   By: Elsie Stain M.D.   On: 06/06/2016 10:49     CBC  Recent Labs Lab 06/06/16 1053 06/06/16 1103  WBC 7.4  --   HGB 12.8 15.0  HCT 38.9 44.0  PLT 223  --   MCV 95.8  --   MCH 31.5  --   MCHC 32.9  --   RDW 14.0  --   LYMPHSABS 2.3  --   MONOABS 0.8  --   EOSABS 0.4  --   BASOSABS 0.1  --     Chemistries   Recent Labs Lab 06/06/16 1103 06/06/16 1213  NA 136 135  K 5.1 5.4*  CL 96* 101  CO2  --  29  GLUCOSE 163* 125*  BUN 26* 16  CREATININE 0.80 0.69  CALCIUM  --  9.2  AST  --  37  ALT  --  16  ALKPHOS  --  54  BILITOT  --  0.5   ------------------------------------------------------------------------------------------------------------------ CrCl cannot be calculated (Unknown ideal weight.). ------------------------------------------------------------------------------------------------------------------ No results for input(s): HGBA1C in the last 72 hours. ------------------------------------------------------------------------------------------------------------------  Recent Labs  06/07/16 0340  CHOL 157  HDL 64  LDLCALC 86  TRIG 36  CHOLHDL 2.5   ------------------------------------------------------------------------------------------------------------------ No results for input(s): TSH, T4TOTAL, T3FREE, THYROIDAB in the last 72 hours.  Invalid input(s): FREET3 ------------------------------------------------------------------------------------------------------------------ No results for input(s): VITAMINB12, FOLATE, FERRITIN, TIBC, IRON, RETICCTPCT in the last 72 hours.  Coagulation profile  Recent Labs Lab 06/06/16 1053  INR 1.11  No results for input(s): DDIMER in the last 72 hours.  Cardiac Enzymes No results for input(s): CKMB, TROPONINI, MYOGLOBIN in the last 168  hours.  Invalid input(s): CK ------------------------------------------------------------------------------------------------------------------ Invalid input(s): POCBNP   CBG:  Recent Labs Lab 06/06/16 1659 06/06/16 1957 06/06/16 2325 06/07/16 0431 06/07/16 0747  GLUCAP 146* 160* 126* 141* 134*       Studies: Mr Brain Wo Contrast  Result Date: 06/07/2016 CLINICAL DATA:  Acute ischemic stroke. EXAM: MRI HEAD WITHOUT CONTRAST MRA HEAD WITHOUT CONTRAST TECHNIQUE: Multiplanar, multiecho pulse sequences of the brain and surrounding structures were obtained without intravenous contrast. Angiographic images of the head were obtained using MRA technique without contrast. COMPARISON:  CT head 06/06/2016 FINDINGS: MRI HEAD FINDINGS Brain: Acute left MCA infarct involving the temporal lobe, insular cortex, and lateral putamen. Small amount of acute infarct in the left frontal parietal operculum. No other acute infarct identified. Possible small amount of hemorrhage in the left lateral putamen. Ventricle size normal. Cerebral volume normal for age. Negative for mass lesion. Moderate chronic microvascular ischemic change in the white matter, pons, and thalamus bilaterally. Vascular: Circle Willis patent. Decreased flow void left M1 branch suggesting occlusion. Skull and upper cervical spine: Negative Sinuses/Orbits: Mild mucosal edema in the paranasal sinuses. Bilateral mastoid effusion. Bilateral lens replacement. No orbital mass lesion. Other: None MRA HEAD FINDINGS Right vertebral dominant. Both vertebral arteries patent to the basilar. PICA patent bilaterally. Basilar widely patent. Superior cerebellar and posterior cerebral arteries widely patent. Left cavernous carotid patent with mild atherosclerotic disease. Left anterior cerebral artery widely patent. Abrupt occlusion left M1 segment. Decreased flow in left MCA branches which do not show flow related signal. Right cavernous carotid widely  patent. Right anterior and middle cerebral arteries widely patent. IMPRESSION: Acute infarct left MCA territory. Probable small amount of hemorrhage in the left lateral putamen. Moderate chronic microvascular ischemic changes. Occlusion of the left M1 segment, probable embolus. No other significant intracranial stenosis. Electronically Signed   By: Marlan Palauharles  Clark M.D.   On: 06/07/2016 07:17   Dg Chest Port 1 View  Result Date: 06/06/2016 CLINICAL DATA:  Right side weakness.  Altered mental status. EXAM: PORTABLE CHEST 1 VIEW COMPARISON:  10/02/2015 FINDINGS: Cardiomegaly with vascular congestion. No overt edema. No confluent opacities or effusions. No acute bony abnormality appear IMPRESSION: Cardiomegaly, vascular congestion. Electronically Signed   By: Charlett NoseKevin  Dover M.D.   On: 06/06/2016 11:53   Mr Maxine GlennMra Head/brain NWWo Cm  Result Date: 06/07/2016 CLINICAL DATA:  Acute ischemic stroke. EXAM: MRI HEAD WITHOUT CONTRAST MRA HEAD WITHOUT CONTRAST TECHNIQUE: Multiplanar, multiecho pulse sequences of the brain and surrounding structures were obtained without intravenous contrast. Angiographic images of the head were obtained using MRA technique without contrast. COMPARISON:  CT head 06/06/2016 FINDINGS: MRI HEAD FINDINGS Brain: Acute left MCA infarct involving the temporal lobe, insular cortex, and lateral putamen. Small amount of acute infarct in the left frontal parietal operculum. No other acute infarct identified. Possible small amount of hemorrhage in the left lateral putamen. Ventricle size normal. Cerebral volume normal for age. Negative for mass lesion. Moderate chronic microvascular ischemic change in the white matter, pons, and thalamus bilaterally. Vascular: Circle Willis patent. Decreased flow void left M1 branch suggesting occlusion. Skull and upper cervical spine: Negative Sinuses/Orbits: Mild mucosal edema in the paranasal sinuses. Bilateral mastoid effusion. Bilateral lens replacement. No orbital mass  lesion. Other: None MRA HEAD FINDINGS Right vertebral dominant. Both vertebral arteries patent to the basilar. PICA patent bilaterally. Basilar widely patent.  Superior cerebellar and posterior cerebral arteries widely patent. Left cavernous carotid patent with mild atherosclerotic disease. Left anterior cerebral artery widely patent. Abrupt occlusion left M1 segment. Decreased flow in left MCA branches which do not show flow related signal. Right cavernous carotid widely patent. Right anterior and middle cerebral arteries widely patent. IMPRESSION: Acute infarct left MCA territory. Probable small amount of hemorrhage in the left lateral putamen. Moderate chronic microvascular ischemic changes. Occlusion of the left M1 segment, probable embolus. No other significant intracranial stenosis. Electronically Signed   By: Marlan Palau M.D.   On: 06/07/2016 07:17   Ct Head Code Stroke W/o Cm  Result Date: 06/06/2016 CLINICAL DATA:  Code stroke.  Aphasia.  Last seen normal 9:15 a.m. EXAM: CT HEAD WITHOUT CONTRAST TECHNIQUE: Contiguous axial images were obtained from the base of the skull through the vertex without intravenous contrast. COMPARISON:  10/12/2015. FINDINGS: Brain: SUBTLE SIGNS OF ASYMMETRIC HYPOATTENUATION LEFT insula and LEFT anterior temporal lobe, concerning for early cytotoxic edema. Atrophy and small vessel disease. No hemorrhage, mass lesion, hydrocephalus, or extra-axial fluid. Vascular: Possible hyperdense LEFT MCA involving distal M1 segment, see image 10 series 2 and image 27 series 4. Skull: Normal. Negative for fracture or focal lesion. Sinuses/Orbits: No acute finding. Other: None. ASPECTS [facility] Stroke Program Early CT Score) - Ganglionic level infarction (caudate, lentiform nuclei, internal capsule, insula, M1-M3 cortex): 5, due to suspected hypodensity of the insula and M2 cortex. - Supraganglionic infarction (M4-M6 cortex): 3 Total score (0-10 with 10 being normal): 8. Findings appear to  represent interval change from priors. IMPRESSION: 1. Concern for hyperdense distal LEFT MCA M1 segment suggesting thrombus. Hypoattenuation LEFT insula and LEFT anterior temporal lobe, possible early infarction 2. ASPECTS is 8. These results were called by telephone at the time of interpretation on 06/06/2016 at 10:44 am to Dr. Grace Isaac, who verbally acknowledged these results. Electronically Signed   By: Elsie Stain M.D.   On: 06/06/2016 10:49      No results found for: HGBA1C Lab Results  Component Value Date   LDLCALC 86 06/07/2016   CREATININE 0.69 06/06/2016       Scheduled Meds: .  stroke: mapping our early stages of recovery book   Does not apply Once  . aspirin  300 mg Rectal Daily   Or  . aspirin  325 mg Oral Daily  . enoxaparin (LOVENOX) injection  40 mg Subcutaneous Q24H  . insulin aspart  0-9 Units Subcutaneous Q4H   Continuous Infusions: . sodium chloride 125 mL/hr at 06/07/16 0227     LOS: 1 day    Time spent: >30 MINS    [facility]  [facility] Pager [phone]. If 7PM-7AM, please contact night-coverage at [email or web address], password [email or web address] 06/07/2016, 8:15 AM  LOS: 1 day

## 2016-06-07 NOTE — Progress Notes (Signed)
Patient transported via bed to MRI.

## 2016-06-07 NOTE — Progress Notes (Signed)
Palliative Medicine consult noted. Due to high referral volume, there may be a delay seeing this patient. Please call the Palliative Medicine Team office at [phone] if recommendations are needed in the interim.  Thank you for inviting us to see this patient.  Margret ChanceMelanie G. Burnell Matlin, RN, BSN, [facility] 06/07/2016 2:47 PM Cell [phone] 8:00-4:00 Monday-Friday Office [phone]

## 2016-06-07 NOTE — Progress Notes (Signed)
PT Cancellation Note  Patient Details Name: Jeanne Thomas MRN: [id number] DOB: 05/20/1917   Cancelled Treatment:    Reason Eval/Treat Not Completed: Other (comment) (pt on bedrest).  PT paged the listed attending MD to address bedrest orders.  PT to check back later today or tomorrow to see if bedrest has been listed and PT/OT can proceed with evaluations.    Thanks,    Rollene Rotundaebecca B. Rashawna Scoles, PT, DPT [phone]   06/07/2016, 2:12 PM

## 2016-06-07 NOTE — Plan of Care (Signed)
Problem: Education: Goal: Knowledge of patient specific risk factors addressed and post discharge goals established will improve Outcome: Not Progressing Patient not able to comprehend teaching at this time.  Family not available.

## 2016-06-07 NOTE — Progress Notes (Signed)
SLP Cancellation Note  Patient Details Name: Rhetta MuraMyrtle E Penafiel MRN: [id number] DOB: 02/19/1918   Cancelled treatment:       Reason Eval/Treat Not Completed: Fatigue/lethargy limiting ability to participate despite several attempts to arouse pt; wet vocal quality noted at rest; ST will f/u when pt able to participate.   ADAMS,PAT ,M.S., CCC-SLP 06/07/2016, 2:50 PM

## 2016-06-07 NOTE — Progress Notes (Signed)
**  Preliminary report by tech**  Carotid artery duplex complete. Findings are consistent with a 1-39 percent stenosis involving the left internal carotid artery. Unable to accurately measure stenosis of the right internal carotid artery due to non-visualization. However, plaque formations noted in the visualized portions of the right carotid system were not of a severe nature. The vertebral arteries demonstrate antegrade flow.   Bilateral lower extremity venous duplex completed. There is no obvious evidence of deep or superficial vein thrombosis involving the right and left lower extremities. All clearly visualized vessels appear patent and compressible. There is no evidence of Baker's cysts bilaterally.   06/07/16 11:18 AM Olen CordialGreg Chevella Pearce RVT

## 2016-06-08 DIAGNOSIS — Z7189 Other specified counseling: Secondary | ICD-10-CM

## 2016-06-08 DIAGNOSIS — Z515 Encounter for palliative care: Secondary | ICD-10-CM

## 2016-06-08 LAB — VAS US CAROTID
LCCAPSYS: 81 cm/s
LEFT ECA DIAS: 0 cm/s
LEFT VERTEBRAL DIAS: -5 cm/s
Left CCA dist dias: -6 cm/s
Left CCA dist sys: -63 cm/s
Left CCA prox dias: 8 cm/s
Left ICA dist dias: -5 cm/s
Left ICA dist sys: -28 cm/s
Left ICA prox dias: -5 cm/s
Left ICA prox sys: -32 cm/s
RCCAPSYS: 73 cm/s
RIGHT ECA DIAS: -6 cm/s
RIGHT VERTEBRAL DIAS: -9 cm/s
Right CCA prox dias: 7 cm/s

## 2016-06-08 LAB — CBC
HCT: 38.8 % (ref 36.0–46.0)
Hemoglobin: 12.9 g/dL (ref 12.0–15.0)
MCH: 31.9 pg (ref 26.0–34.0)
MCHC: 33.2 g/dL (ref 30.0–36.0)
MCV: 96 fL (ref 78.0–100.0)
PLATELETS: 184 10*3/uL (ref 150–400)
RBC: 4.04 MIL/uL (ref 3.87–5.11)
RDW: 14.3 % (ref 11.5–15.5)
WBC: 9.8 10*3/uL (ref 4.0–10.5)

## 2016-06-08 LAB — COMPREHENSIVE METABOLIC PANEL
ALK PHOS: 55 U/L (ref 38–126)
ALT: 14 U/L (ref 14–54)
AST: 31 U/L (ref 15–41)
Albumin: 3.7 g/dL (ref 3.5–5.0)
Anion gap: 12 (ref 5–15)
BUN: 11 mg/dL (ref 6–20)
CALCIUM: 9.4 mg/dL (ref 8.9–10.3)
CO2: 22 mmol/L (ref 22–32)
CREATININE: 0.51 mg/dL (ref 0.44–1.00)
Chloride: 103 mmol/L (ref 101–111)
GFR calc non Af Amer: >60 mL/min (ref >60)
Glucose, Bld: 93 mg/dL (ref 65–99)
Potassium: 3.6 mmol/L (ref 3.5–5.1)
Sodium: 137 mmol/L (ref 135–145)
Total Bilirubin: 1.6 mg/dL — ABNORMAL HIGH (ref 0.3–1.2)
Total Protein: 7.7 g/dL (ref 6.5–8.1)

## 2016-06-08 LAB — HEMOGLOBIN A1C
Hgb A1c MFr Bld: 5.7 % — ABNORMAL HIGH (ref 4.8–5.6)
Mean Plasma Glucose: 117 mg/dL

## 2016-06-08 LAB — GLUCOSE, CAPILLARY
GLUCOSE-CAPILLARY: 111 mg/dL — AB (ref 65–99)
Glucose-Capillary: 88 mg/dL (ref 65–99)
Glucose-Capillary: 81 mg/dL (ref 65–99)
Glucose-Capillary: 113 mg/dL — ABNORMAL HIGH (ref 65–99)

## 2016-06-08 MED ORDER — ONDANSETRON HCL 4 MG/2ML IJ SOLN: 4.0000 mg | Freq: Four times a day (QID) | INTRAMUSCULAR | Status: DC | PRN

## 2016-06-08 MED ORDER — GLYCOPYRROLATE 0.2 MG/ML IJ SOLN
0.2000 mg | INTRAMUSCULAR | Status: DC | PRN
  Administered 2016-06-08: 0.2 mg via INTRAVENOUS
  Filled 2016-06-08: qty 1

## 2016-06-08 MED ORDER — GLYCOPYRROLATE 0.2 MG/ML IJ SOLN: 0.2000 mg | INTRAMUSCULAR | 0 refills | Status: AC | PRN

## 2016-06-08 MED ORDER — BISACODYL 10 MG RE SUPP: 10.0000 mg | Freq: Every day | RECTAL | Status: DC | PRN

## 2016-06-08 MED ORDER — POLYVINYL ALCOHOL 1.4 % OP SOLN
1.0000 [drp] | Freq: Four times a day (QID) | OPHTHALMIC | Status: DC | PRN
  Filled 2016-06-08: qty 15

## 2016-06-08 MED ORDER — HALOPERIDOL LACTATE 5 MG/ML IJ SOLN: 0.5000 mg | INTRAMUSCULAR | Status: DC | PRN

## 2016-06-08 MED ORDER — BIOTENE DRY MOUTH MT LIQD: 15.0000 mL | OROMUCOSAL | Status: DC | PRN

## 2016-06-08 MED ORDER — HALOPERIDOL LACTATE 5 MG/ML IJ SOLN: 0.5000 mg | INTRAMUSCULAR | 0 refills | Status: AC | PRN

## 2016-06-08 NOTE — Consult Note (Signed)
[facility]  Received request from CSW Maralyn SagoSarah [facility] Chart reviewed. Patient's [facility]areSadie [facility]. CSW Sarah aware.   Please fax discharge summary to [phone].  RN please call report to [phone].  Thank you,  Forrestine Himva Davis, LCSW [phone]

## 2016-06-08 NOTE — Progress Notes (Signed)
Pt  D/C to Hospice. [facility] Okey Regalarol RN

## 2016-06-08 NOTE — Clinical Social Work Note (Signed)
[facility] that patient's sister is on her way to the facility to fill out paperwork. CSW paged MD to notify her that patient can discharge there today.  Charlynn CourtSarah Bertice Risse, CSW [phone]

## 2016-06-08 NOTE — Care Management Note (Signed)
Case Management Note  Patient Details  Name: Jeanne Thomas MRN: [id number] Date of Birth: 09/24/1917  Subjective/Objective:                    Action/Plan: [facility] No further needs per CM.   Expected Discharge Date:                  Expected Discharge Plan:  [facility]ospice Medical Facility  In-House Referral:     Discharge planning Services     Post Acute Care Choice:    Choice offered to:     DME Arranged:    DME Agency:     HH Arranged:    HH Agency:     Status of Service:  Completed, signed off  If discussed at [employer] [facility] Stay Meetings, dates discussed:    Additional Comments:  [name], RN 06/08/2016, 2:56 PM

## 2016-06-08 NOTE — Progress Notes (Signed)
Physical Therapy Treatment/discharge summary Patient Details Name: Jeanne Thomas MRN: [id number] DOB: 10/27/1917 Today's Date: 06/08/2016    History of Present Illness 83 y.o. female admitted to [facility] on 06/06/16 for unresponsiveness and right sided weakness.  Pt dx with acute L MCA infarct (per MRI).  Pt with significant PMHx of HTN, DM, and CHF.    PT Comments    Pt unable to participate in mobility tasks today.  Therapy performed total assist +2 transfer (pt = 0% participation) during anterior-posterior transfer using slide pad.  Total assist to position self.  She responded to visual stimulation, tactile stimulation while therapist washed pt's face.  She did not open eyes upon command or in response to her name.  She did not verbally respond when asked about her pain.  Received therapy discontinuation orders after session.  Will therefore formally discharge pt from PT.  Please re-order if appropriate.   Follow Up Recommendations  SNF;Supervision/Assistance - 24 hour     Equipment Recommendations  Other (comment) (TBD at next venue of care)    Recommendations for Other Services       Precautions / Restrictions Precautions Precautions: Fall Precaution Comments: decr mobility Restrictions Weight Bearing Restrictions: No    Mobility  Bed Mobility Overal bed mobility: Needs Assistance Bed Mobility: Supine to Sit     Supine to sit: +2 for physical assistance;Total assist;HOB elevated     General bed mobility comments: pt did not participate  Transfers Overall transfer level: Needs assistance Equipment used: None Transfers: Licensed conveyancerAnterior-Posterior Transfer       Anterior-Posterior transfers: +2 physical assistance;Total assist   General transfer comment: recommend nursing staff use mechanical lift only  Ambulation/Gait                 Stairs            Wheelchair Mobility    Modified Rankin (Stroke Patients Only) Modified Rankin (Stroke Patients  Only) Pre-Morbid Rankin Score: Severe disability Modified Rankin: Severe disability     Balance Overall balance assessment: Needs assistance Sitting-balance support: Feet unsupported;Single extremity supported Sitting balance-Leahy Scale: Zero Sitting balance - Comments: pt did not initiate self sitting balance, loss of balance all directions Postural control: Other (comment) (LOB all directions, no righting reactions)                          Cognition Arousal/Alertness: Lethargic Behavior During Therapy: Flat affect Overall Cognitive Status: Impaired/Different from baseline Area of Impairment: Orientation;Attention;Memory;Following commands;Safety/judgement;Awareness;Problem solving Orientation Level: Disoriented to;Time;Situation Current Attention Level: Focused   Following Commands: Follows one step commands inconsistently Safety/Judgement: Decreased awareness of deficits Awareness: Intellectual Problem Solving: Decreased initiation;Difficulty sequencing General Comments: non-righting reactions with sitting balance    Exercises      General Comments General comments (skin integrity, edema, etc.): unable to PROM DF bil ankles to neutral, unable to PROM Rt fingers #2 and #3 to full ext      Pertinent Vitals/Pain Pain Assessment: Faces Faces Pain Scale: No hurt    Home Living                      Prior Function            PT Goals (current goals can now be found in the care plan section) Acute Rehab PT Goals Patient Stated Goal: unable to state/respond PT Goal Formulation: Patient unable to participate in goal setting Progress towards PT goals: Not progressing toward goals -  comment    Frequency    Other (Comment) (received MD order to discontinue PT after session)      PT Plan Current plan remains appropriate    Co-evaluation             End of Session   Activity Tolerance: Patient limited by lethargy Patient left: in  chair;with call bell/phone within reach;with nursing/sitter in room     Time: 0857-0910 PT Time Calculation (min) (ACUTE ONLY): 13 min  Charges:  $Therapeutic Activity: 8-22 mins                    G CodesCarrie Mew, PT [phone]  Jeanne Thomas 06/08/2016, 1:54 PM

## 2016-06-08 NOTE — Clinical Social Work Note (Signed)
Clinical Social Work Assessment  Patient Details  Name: Jeanne Thomas MRN: [id number] Date of Birth: 06/04/1917  Date of referral:  06/08/16               Reason for consult:  Facility Placement, End of Life/Hospice, Discharge Planning                Permission sought to share information with:  Facility Medical sales representativeContact Representative, Family Supports Permission granted to share information::  Yes, Verbal Permission Granted  Name::     Jeanne SalkSadie Thomas  Agency::  [facility]  Relationship::  Sister/HCPOA  Contact Information:  [phone]  Housing/Transportation Living arrangements for the past 2 months:  Skilled Building surveyorursing Facility Source of Information:  Medical Team, Power of Attorney Patient Interpreter Needed:  None Criminal Activity/Legal Involvement Pertinent to Current Situation/Hospitalization:  No - Comment as needed Significant Relationships:  Siblings, Other(Comment) Jeanne Thomas(Godson) Lives with:  Facility Resident Do you feel safe going back to the place where you live?  No Need for family participation in patient care:  Yes (Comment)  Care giving concerns:  [facility]. Patient in need of inpatient hospice facility.   Social Worker assessment / plan:  Patient not oriented. CSW called patient's sister as she was not in the room. CSW introduced role and explained inquired about [facility], per consult from palliative team. Patient's sister confirmed. She stated that the facility had already called her, had a bed, and needed to do paperwork with her. Patient's sister does not drive so she is waiting on someone to take her over there. [facility]. No further concerns. CSW encouraged patient's sister to contact CSW as needed. CSW will continue to follow patient and her sister for support and facilitate [facility] once a bed is available and paperwork is completed.  Employment status:   Retired Health and safety inspectornsurance information:  Medicare PT Recommendations:  Skilled Nursing Facility Information / Referral to community resources:  Other (Comment Required) (Inpatient hospice facility)  Patient/Family's Response to care:  Patient not oriented. Patient's sister agreeable to inpatient hospice facility. Patient's sister and godson are supportive and involved in patient's care. Patient's sister appreciated social work intervention.  Patient/Family's Understanding of and Emotional Response to Diagnosis, Current Treatment, and Prognosis:  Patient not oriented. Patient's sister has a good understanding of the reason for patient's admission and prognosis. Patient's sister appears happy with hospital care.  Emotional Assessment Appearance:  Appears stated age Attitude/Demeanor/Rapport:  Unable to Assess Affect (typically observed):  Unable to Assess Orientation:    Alcohol / Substance use:  Never Used Psych involvement (Current and /or in the community):  No (Comment)  Discharge Needs  Concerns to be addressed:  Care Coordination Readmission within the last 30 days:  No Current discharge risk:  Cognitively Impaired, Dependent with Mobility, Terminally ill Barriers to Discharge:  No Barriers Identified   Margarito LinerSarah C Quandre Polinski, LCSW 06/08/2016, 11:12 AM

## 2016-06-08 NOTE — Consult Note (Signed)
Consultation Note Date: 06/08/2016   Patient Name: Jeanne Thomas  DOB: 09/28/1917  MRN: [id number]  Age / Sex: 83 y.o., female  PCP: Florentina JennyHenry Tripp, MD Referring Physician: Alison MurrayAlma M Devine, MD  Reason for Consultation: Establishing goals of care  HPI/Patient Profile: 83 y.o. female  with past medical history of CHF, HTN, DM2, hypothyroidism, and cerebral infarction who presented to the ED with unresponsiveness and right sided weakness and was subsequently admitted on 06/06/2016 with a left MCA territory infarct in the setting of left M1 occlusion with resultant small left putamen hemorrhage. She was not given tPA due to angioedema. She is now obtunded with left gaze preference, aphasic, and with right-sided hemiplegia. DNR. Palliative consulted to assist family in goals of care.   Clinical Assessment and Goals of Care: [name] is unable to participate in goals of care due to obtunded status. I had a long conversation over the phone with her sister, Meryl DareSadie, who is her HCPOA. Sadie also notes that [name] does have a god-son, but no husband or children.   Sadie had a good grasp on Santasia's present health issues. She was able to articulate that her sister's stroke resulted in significant life-limiting issues, including being unable to adequately eat or drink due to lethargy/confusion. She was also clear that, at 83yo, nothing should be done to her sister that could prolong her life and leave her in this state of debility. She wants to entirely focus on keeping her safe and comfortable and ensuring that she passes with dignity and without suffering. To that end, she asked about placement in residential hospice. She was very familiar with [facility] (and lives right nearby), and asked if [name] would be appropriate for their facility. Since she is currently not eating or drinking and is obtunded with right-sided hemiplegia, I believe she would qualify for residential  placement.   Primary Decision Maker HCPOA, pt's sister.    SUMMARY OF RECOMMENDATIONS    DNR, full comfort care  Medications and orders adjusted for comfort  SW consulted for residential Hospice placement; I also called [facility] directly to help expedite this  Code Status/Advance Care Planning:  DNR  Symptom Management:   Pain, fever: Tylenol  Constipation: Dulcolax  Secretions: Robinul  Agitation: Haldol  Nausea/voimiting: Zofran  Oral care: Biotene  Palliative Prophylaxis:   Bowel Regimen, Frequent Pain Assessment, Oral Care and Turn Reposition  Additional Recommendations (Limitations, Scope, Preferences):  Full Comfort Care  Psycho-social/Spiritual:   Desire for further Chaplaincy support:no  Additional Recommendations: Education on Hospice  Prognosis:   < 2 weeks; pt is not eating or drinking and remains predominantly unresponsive.   Discharge Planning: Hospice facility      Primary Diagnoses: Present on Admission: . Acute ischemic stroke (HCC) . Chronic diastolic heart failure (HCC) . Essential hypertension . Hypothyroidism, acquired . Tongue swelling   I have reviewed the medical record, interviewed the patient and family, and examined the patient. The following aspects are pertinent.  Past Medical History:  Diagnosis Date  . CHF (congestive heart failure) (HCC)   . Diabetes mellitus without complication (HCC)   . GERD (gastroesophageal reflux disease)   . Hypertension   . Hypothyroidism   . Insomnia    Social History   Social History  . Marital status: Single    Spouse name: N/A  . Number of children: N/A  . Years of education: N/A   Social History Main Topics  . Smoking status: Never Smoker  . Smokeless tobacco: Never Used  .  Alcohol use No  . Drug use: No  . Sexual activity: No   Other Topics Concern  . None   Social History Narrative  . None   Family History  Problem Relation Age of Onset  . Diabetes Other   .  Hypertension Other   . Stroke Son   . Diabetes Son    Scheduled Meds: .  stroke: mapping our early stages of recovery book   Does not apply Once  . aspirin  300 mg Rectal Daily   Or  . aspirin  325 mg Oral Daily  . enoxaparin (LOVENOX) injection  40 mg Subcutaneous Q24H  . insulin aspart  0-9 Units Subcutaneous Q4H   Continuous Infusions: . sodium chloride 125 mL/hr at 06/07/16 0227   PRN Meds:.acetaminophen **OR** acetaminophen (TYLENOL) oral liquid 160 mg/5 mL **OR** acetaminophen No Known Allergies   Review of Systems: Pt predominantly unresponsive.   Physical Exam  Constitutional: She appears well-developed and well-nourished. She has a sickly appearance. No distress.  HENT:  Head: Normocephalic and atraumatic.  Swollen tongue, unable to visualize mouth and pt unable to follow commands  Eyes:  Closed, gaze downward  Cardiovascular: Normal rate.   Pulmonary/Chest: Tachypnea noted.  Snoring; rhonchi throughout, audible secretions with breathing  Abdominal: Soft. Bowel sounds are normal.  Neurological: She is unresponsive.  Predominantly unresponsive. Will open eyes briefly to repeat physical stimuli.  Skin: Skin is warm and dry. There is pallor.  Psychiatric:  Calm in bedside chair    Vital Signs: BP 137/69 (BP Location: Left Leg)   Pulse 94   Temp 98.1 F (36.7 C) (Oral)   Resp 20   Wt 80.4 kg (177 lb 4 oz) Comment: stroke bed  SpO2 100%   BMI 61.54 kg/m  Pain Assessment: PAINAD     SpO2: SpO2: 100 % O2 Device:SpO2: 100 % O2 Flow Rate: .   IO: Intake/output summary:  Intake/Output Summary (Last 24 hours) at 06/08/16 0910 Last data filed at 06/07/16 1200  Gross per 24 hour  Intake                0 ml  Output                0 ml  Net                0 ml    LBM: Last BM Date: 06/06/16 Baseline Weight: Weight: 80.4 kg (177 lb 4 oz) (stroke bed) Most recent weight: Weight: 80.4 kg (177 lb 4 oz) (stroke bed)     Palliative Assessment/Data: PPS  10%   Flowsheet Rows   Flowsheet Row Most Recent Value  Intake Tab  Referral Department  Hospitalist  Unit at Time of Referral  Med/Surg Unit  Palliative Care Primary Diagnosis  Neurology  Date Notified  06/06/16  Palliative Care Type  New Palliative care  Reason for referral  Clarify Goals of Care  Date of Admission  06/06/16  # of days IP prior to Palliative referral  0  Clinical Assessment  Psychosocial & Spiritual Assessment  Palliative Care Outcomes      Time Total: 70 minutes  Greater than 50%  of this time was spent counseling and coordinating care related to the above assessment and plan.  Signed by: Murrell Converse, NP Palliative Medicine Team Pager # [phone] (M-F 7a-5p) Team Phone # [phone] (Nights/Weekends)

## 2016-06-08 NOTE — Clinical Social Work Note (Signed)
CSW facilitated patient discharge including contacting patient family and facility to confirm patient discharge plans. Clinical information faxed to facility and family agreeable with plan. CSW arranged ambulance transport via [facility] to [facility]. RN to call report prior to discharge ([phone]).  CSW will sign off for now as social work intervention is no longer needed. Please consult us again if new needs arise.  Camiah Humm, CSW [phone]  

## 2016-06-08 NOTE — Discharge Summary (Signed)
Physician Discharge Summary  Jeanne SniderMyrtle E Thomas ZOX:[id number] DOB: 07/24/1917 DOA: 06/06/2016  PCP: Florentina JennyRIPP, HENRY, MD  Admit date: 06/06/2016 Discharge date: 06/08/2016  Recommendations for Outpatient Follow-up:  1. May use haldol for agitation or restlessness   Discharge Diagnoses:  Principal Problem:   Acute ischemic stroke [facility](HCC) Active Problems:   Chronic diastolic heart failure (HCC)   Essential hypertension   Type 2 diabetes mellitus without complication, without long-term current use of insulin (HCC)   Hypothyroidism, acquired   Tongue swelling   Cerebral infarction due to thrombosis of left middle cerebral artery (HCC)   Goals of care, counseling/discussion   Palliative care by specialist   End of life care    Discharge Condition: stable   Diet recommendation: as tolerated   History of present illness:   Per brief narrative 06/07/2016  "83 y.o.femalewith a past medical history significant for HTN, HFpEF, NIDDM, and hypothyroidismwho presents with unresponsiveness and right sided weakness.[facility].patient had some tongue swelling, initially thought to be angioedema from lisinopril, and so TPA was not administered,patient wound not want intubation, CPR or other invasive treatments, but would want to focus on comfort, dignity and regaining function if possible"  Hospital Course:   Assessment and plan Acute CVA - MRI  Acute infarct left MCA territory - D/C to resid hospice as focus on comfort care at this point   Angioedema - Stable   Essential hypertension -Permissive hypertension for now  Chronic diastolic CHF - Stable resp status   Hypothyroidism - Focus on comfort  Type 2 diabetes without complications  - Not on any meds - Focus on comfort    DVT prophylaxsis Lovenox Code Status:  DO NOT RESUSCITATE Family Communication: No family at the bedside this am    Signed:  Manson PasseyEVINE, Trever Streater, MD  [facility] 06/08/2016, 4:33 PM  Pager #: [phone]  Time spent in minutes:less than 30 minutes   Discharge Exam: Vitals:   06/08/16 0913 06/08/16 1312  BP: (!) 149/72 (!) 142/82  Pulse: 95 (!) 109  Resp: 20 20  Temp: 98.3 F (36.8 C) 98.7 F (37.1 C)   Vitals:   06/08/16 0147 06/08/16 0528 06/08/16 0913 06/08/16 1312  BP: (!) 134/45 137/69 (!) 149/72 (!) 142/82  Pulse: 95 94 95 (!) 109  Resp: 20 20 20 20   Temp: 98.3 F (36.8 C) 98.1 F (36.7 C) 98.3 F (36.8 C) 98.7 F (37.1 C)  TempSrc: Oral Oral Oral Axillary  SpO2: 100% 100% 100% 99%  Weight:        General: Pt is alert, follows commands appropriately, not in acute distress Cardiovascular: Regular rate and rhythm, S1/S2 + Respiratory: coarse breath sounds, no wheezing Abdominal: Soft, non tender, non distended, bowel sounds +, no guarding Extremities: no cyanosis, pulses palpable bilaterally DP and PT Neuro: Grossly nonfocal  Discharge Instructions  Discharge Instructions    Call MD for:  persistant nausea and vomiting    Complete by:  As directed    Call MD for:  redness, tenderness, or signs of infection (pain, swelling, redness, odor or green/yellow discharge around incision site)    Complete by:  As directed    Call MD for:  severe uncontrolled pain    Complete by:  As directed    Diet - low sodium heart healthy    Complete by:  As directed    Increase activity slowly    Complete by:  As directed      Allergies  as of 06/08/2016   No Known Allergies     Medication List    STOP taking these medications   amLODipine 5 MG tablet Commonly known as:  NORVASC   COLACE 100 MG capsule Generic drug:  docusate sodium   furosemide 20 MG tablet Commonly known as:  LASIX   lisinopril 2.5 MG tablet Commonly known as:  PRINIVIL,ZESTRIL   loperamide 2 MG capsule Commonly known as:  IMODIUM   loratadine 10 MG tablet Commonly known as:  CLARITIN   meclizine 12.5 MG tablet Commonly known as:   ANTIVERT   metoprolol tartrate 25 MG tablet Commonly known as:  LOPRESSOR   omeprazole 20 MG capsule Commonly known as:  PRILOSEC   oxyCODONE-acetaminophen 5-325 MG tablet Commonly known as:  PERCOCET/ROXICET   polyethylene glycol packet Commonly known as:  MIRALAX / GLYCOLAX   potassium chloride SA 20 MEQ tablet Commonly known as:  K-DUR,KLOR-CON   senna-docusate 8.6-50 MG tablet Commonly known as:  Senokot-S   traMADol 50 MG tablet Commonly known as:  ULTRAM   Vitamin D3 2000 units Tabs     TAKE these medications   acetaminophen 325 MG tablet Commonly known as:  TYLENOL Take 650 mg by mouth 2 (two) times daily.   ASPERCREME LIDOCAINE 4 % Ptch Generic drug:  Lidocaine Apply 1 patch topically 2 (two) times daily.   bisacodyl 5 MG EC tablet Commonly known as:  DULCOLAX Take 5 mg by mouth daily as needed for mild constipation or moderate constipation.   glycopyrrolate 0.2 MG/ML injection Commonly known as:  ROBINUL Inject 1 mL (0.2 mg total) into the vein every 4 (four) hours as needed (excessive secretions).   guaiFENesin 100 MG/5ML liquid Commonly known as:  ROBITUSSIN Take 200 mg by mouth every 4 (four) hours as needed for cough.   haloperidol lactate 5 MG/ML injection Commonly known as:  HALDOL Inject 0.1 mLs (0.5 mg total) into the vein every 4 (four) hours as needed (or delirium).      Follow-up Information    Florentina Jenny, MD Follow up.   Specialty:  Family Medicine Contact information: [address] [place] [address] [phone]            The results of significant diagnostics from this hospitalization (including imaging, microbiology, ancillary and laboratory) are listed below for reference.    Significant Diagnostic Studies: Mr Brain Wo Contrast  Result Date: 06/07/2016 CLINICAL DATA:  Acute ischemic stroke. EXAM: MRI HEAD WITHOUT CONTRAST MRA HEAD WITHOUT CONTRAST TECHNIQUE: Multiplanar, multiecho pulse sequences  of the brain and surrounding structures were obtained without intravenous contrast. Angiographic images of the head were obtained using MRA technique without contrast. COMPARISON:  CT head 06/06/2016 FINDINGS: MRI HEAD FINDINGS Brain: Acute left MCA infarct involving the temporal lobe, insular cortex, and lateral putamen. Small amount of acute infarct in the left frontal parietal operculum. No other acute infarct identified. Possible small amount of hemorrhage in the left lateral putamen. Ventricle size normal. Cerebral volume normal for age. Negative for mass lesion. Moderate chronic microvascular ischemic change in the white matter, pons, and thalamus bilaterally. Vascular: Circle Willis patent. Decreased flow void left M1 branch suggesting occlusion. Skull and upper cervical spine: Negative Sinuses/Orbits: Mild mucosal edema in the paranasal sinuses. Bilateral mastoid effusion. Bilateral lens replacement. No orbital mass lesion. Other: None MRA HEAD FINDINGS Right vertebral dominant. Both vertebral arteries patent to the basilar. PICA patent bilaterally. Basilar widely patent. Superior cerebellar and posterior cerebral arteries widely patent.  Left cavernous carotid patent with mild atherosclerotic disease. Left anterior cerebral artery widely patent. Abrupt occlusion left M1 segment. Decreased flow in left MCA branches which do not show flow related signal. Right cavernous carotid widely patent. Right anterior and middle cerebral arteries widely patent. IMPRESSION: Acute infarct left MCA territory. Probable small amount of hemorrhage in the left lateral putamen. Moderate chronic microvascular ischemic changes. Occlusion of the left M1 segment, probable embolus. No other significant intracranial stenosis. Electronically Signed   By: Marlan Palau M.D.   On: 06/07/2016 07:17   Dg Chest Port 1 View  Result Date: 06/06/2016 CLINICAL DATA:  Right side weakness.  Altered mental status. EXAM: PORTABLE CHEST 1 VIEW  COMPARISON:  10/02/2015 FINDINGS: Cardiomegaly with vascular congestion. No overt edema. No confluent opacities or effusions. No acute bony abnormality appear IMPRESSION: Cardiomegaly, vascular congestion. Electronically Signed   By: Charlett Nose M.D.   On: 06/06/2016 11:53   Mr Maxine Glenn Head/brain ZO Cm  Result Date: 06/07/2016 CLINICAL DATA:  Acute ischemic stroke. EXAM: MRI HEAD WITHOUT CONTRAST MRA HEAD WITHOUT CONTRAST TECHNIQUE: Multiplanar, multiecho pulse sequences of the brain and surrounding structures were obtained without intravenous contrast. Angiographic images of the head were obtained using MRA technique without contrast. COMPARISON:  CT head 06/06/2016 FINDINGS: MRI HEAD FINDINGS Brain: Acute left MCA infarct involving the temporal lobe, insular cortex, and lateral putamen. Small amount of acute infarct in the left frontal parietal operculum. No other acute infarct identified. Possible small amount of hemorrhage in the left lateral putamen. Ventricle size normal. Cerebral volume normal for age. Negative for mass lesion. Moderate chronic microvascular ischemic change in the white matter, pons, and thalamus bilaterally. Vascular: Circle Willis patent. Decreased flow void left M1 branch suggesting occlusion. Skull and upper cervical spine: Negative Sinuses/Orbits: Mild mucosal edema in the paranasal sinuses. Bilateral mastoid effusion. Bilateral lens replacement. No orbital mass lesion. Other: None MRA HEAD FINDINGS Right vertebral dominant. Both vertebral arteries patent to the basilar. PICA patent bilaterally. Basilar widely patent. Superior cerebellar and posterior cerebral arteries widely patent. Left cavernous carotid patent with mild atherosclerotic disease. Left anterior cerebral artery widely patent. Abrupt occlusion left M1 segment. Decreased flow in left MCA branches which do not show flow related signal. Right cavernous carotid widely patent. Right anterior and middle cerebral arteries widely  patent. IMPRESSION: Acute infarct left MCA territory. Probable small amount of hemorrhage in the left lateral putamen. Moderate chronic microvascular ischemic changes. Occlusion of the left M1 segment, probable embolus. No other significant intracranial stenosis. Electronically Signed   By: Marlan Palau M.D.   On: 06/07/2016 07:17   Ct Head Code Stroke W/o Cm  Result Date: 06/06/2016 CLINICAL DATA:  Code stroke.  Aphasia.  Last seen normal 9:15 a.m. EXAM: CT HEAD WITHOUT CONTRAST TECHNIQUE: Contiguous axial images were obtained from the base of the skull through the vertex without intravenous contrast. COMPARISON:  10/12/2015. FINDINGS: Brain: SUBTLE SIGNS OF ASYMMETRIC HYPOATTENUATION LEFT insula and LEFT anterior temporal lobe, concerning for early cytotoxic edema. Atrophy and small vessel disease. No hemorrhage, mass lesion, hydrocephalus, or extra-axial fluid. Vascular: Possible hyperdense LEFT MCA involving distal M1 segment, see image 10 series 2 and image 27 series 4. Skull: Normal. Negative for fracture or focal lesion. Sinuses/Orbits: No acute finding. Other: None. ASPECTS [facility] Stroke Program Early CT Score) - Ganglionic level infarction (caudate, lentiform nuclei, internal capsule, insula, M1-M3 cortex): 5, due to suspected hypodensity of the insula and M2 cortex. - Supraganglionic infarction (M4-M6 cortex): 3 Total score (  0-10 with 10 being normal): 8. Findings appear to represent interval change from priors. IMPRESSION: 1. Concern for hyperdense distal LEFT MCA M1 segment suggesting thrombus. Hypoattenuation LEFT insula and LEFT anterior temporal lobe, possible early infarction 2. ASPECTS is 8. These results were called by telephone at the time of interpretation on 06/06/2016 at 10:44 am to Dr. Grace Isaac, who verbally acknowledged these results. Electronically Signed   By: Elsie Stain M.D.   On: 06/06/2016 10:49    Microbiology: No results found for this or any previous visit (from the past  240 hour(s)).   Labs: Basic Metabolic Panel:  Recent Labs Lab 06/06/16 1103 06/06/16 1213 06/08/16 0407  NA 136 135 137  K 5.1 5.4* 3.6  CL 96* 101 103  CO2  --  29 22  GLUCOSE 163* 125* 93  BUN 26* 16 11  CREATININE 0.80 0.69 0.51  CALCIUM  --  9.2 9.4   Liver Function Tests:  Recent Labs Lab 06/06/16 1213 06/08/16 0407  AST 37 31  ALT 16 14  ALKPHOS 54 55  BILITOT 0.5 1.6*  PROT 6.5 7.7  ALBUMIN 3.2* 3.7   No results for input(s): LIPASE, AMYLASE in the last 168 hours. No results for input(s): AMMONIA in the last 168 hours. CBC:  Recent Labs Lab 06/06/16 1053 06/06/16 1103 06/08/16 0407  WBC 7.4  --  9.8  NEUTROABS 3.9  --   --   HGB 12.8 15.0 12.9  HCT 38.9 44.0 38.8  MCV 95.8  --  96.0  PLT 223  --  184   Cardiac Enzymes: No results for input(s): CKTOTAL, CKMB, CKMBINDEX, TROPONINI in the last 168 hours. BNP: BNP (last 3 results) No results for input(s): BNP in the last 8760 hours.  ProBNP (last 3 results) No results for input(s): PROBNP in the last 8760 hours.  CBG:  Recent Labs Lab 06/07/16 2044 06/07/16 2349 06/08/16 0408 06/08/16 0802 06/08/16 1120  GLUCAP 106* 98 88 111* 81

## 2016-06-24 DEATH — deceased

## 2016-09-21 ENCOUNTER — Other Ambulatory Visit: Payer: Self-pay

## 2016-09-21 NOTE — Patient Outreach (Signed)
Contact number was restricted. Unable to obtain mRS

## 2018-03-07 IMAGING — CT CT HEAD CODE STROKE
3 series · 15 of 47 positions shown, 18 images · non-contrast
Comparison: 10/12/2015.

CLINICAL DATA: Code stroke.  Aphasia.  Last seen normal [DATE] a.m.

EXAM:
CT HEAD WITHOUT CONTRAST
TECHNIQUE: Contiguous axial images were obtained from the base of the skull
through the vertex without intravenous contrast.

[Series 2: head 5.0 st · axial · 0.43mm/px · z∈[-4,+126]mm · 9 of 32 slices shown, 12 images]
[im 3/32  brain]
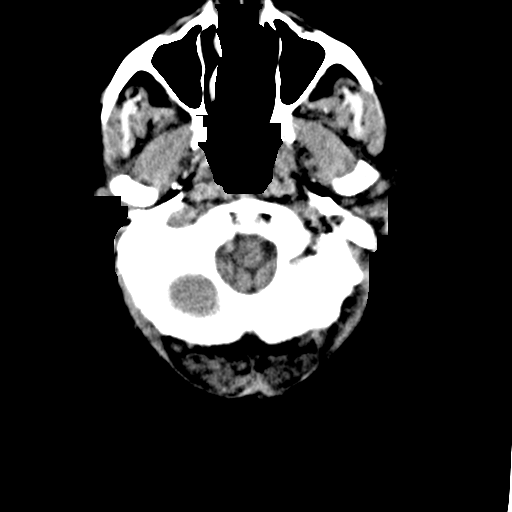
[im 3/32  bone]
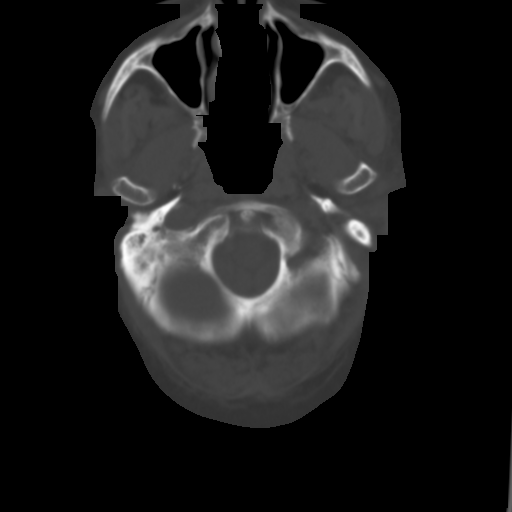
[im 6/32  brain]
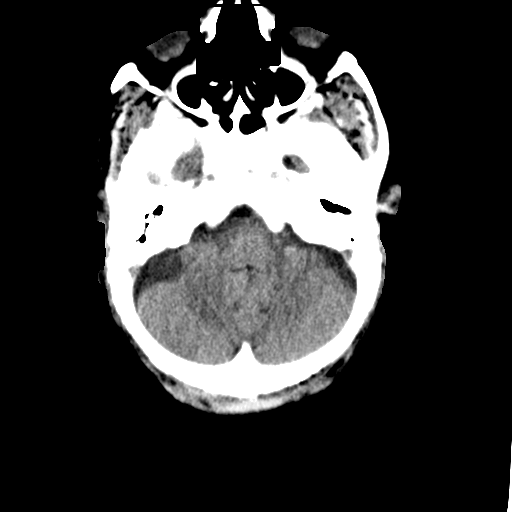
[im 9/32  brain]
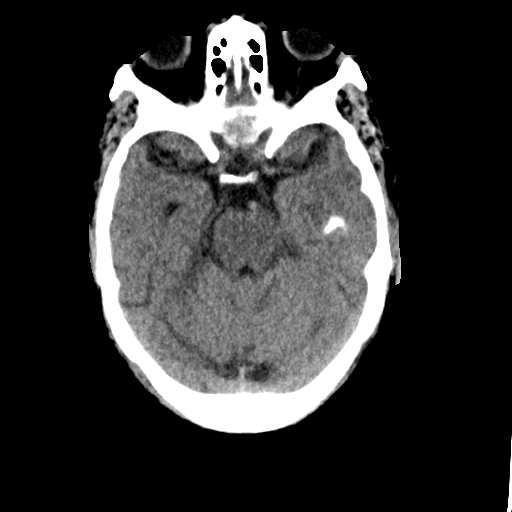
[im 12/32  brain]
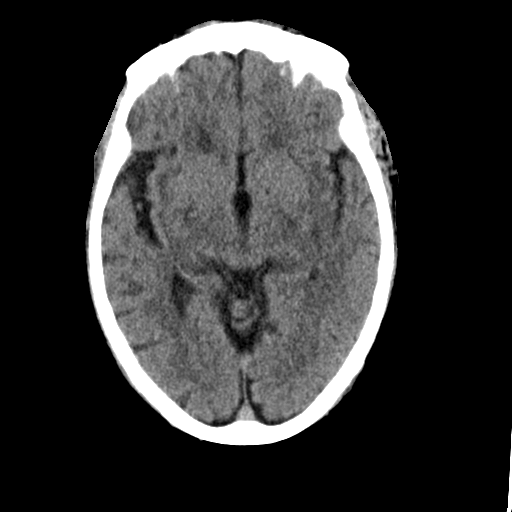
[im 17/32  brain]
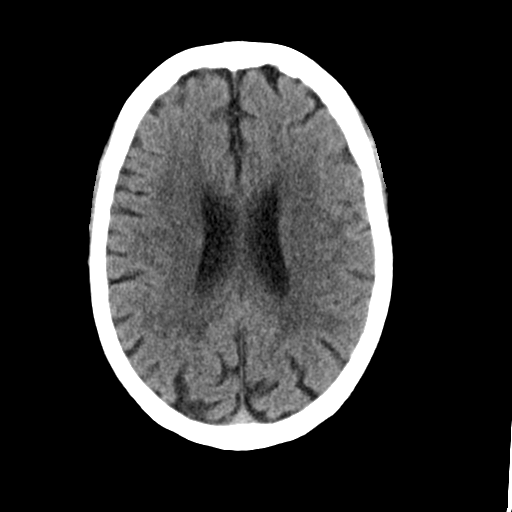
[im 17/32  bone]
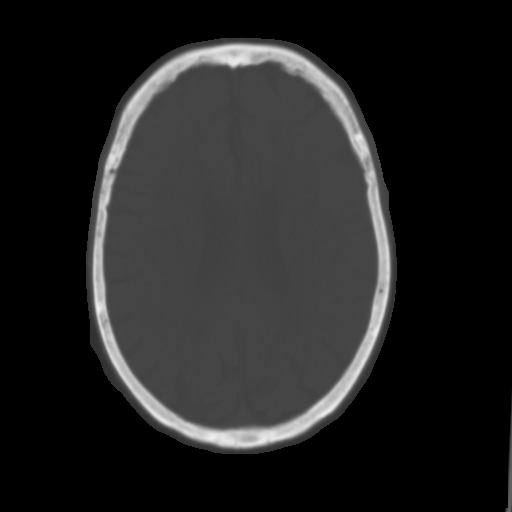
[im 20/32  brain]
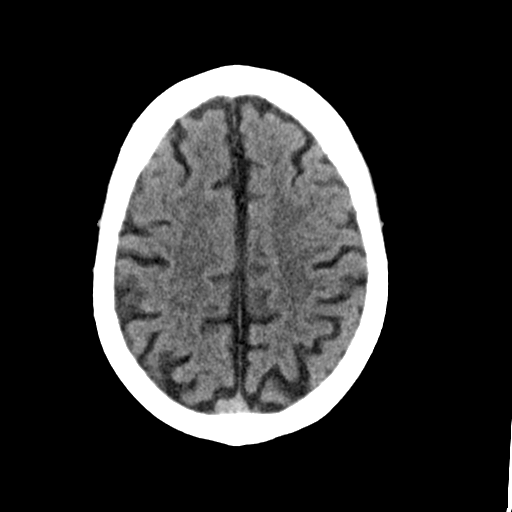
[im 23/32  brain]
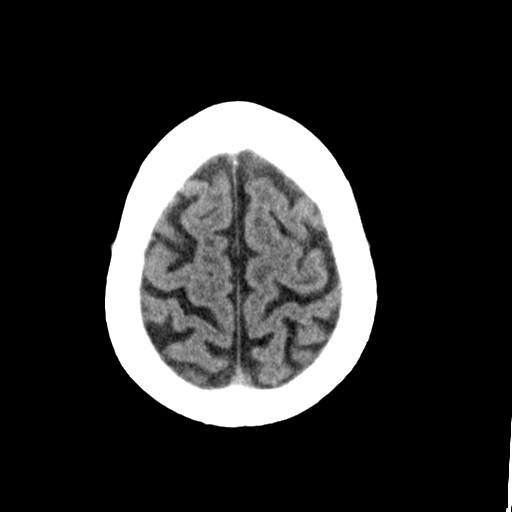
[im 26/32  brain]
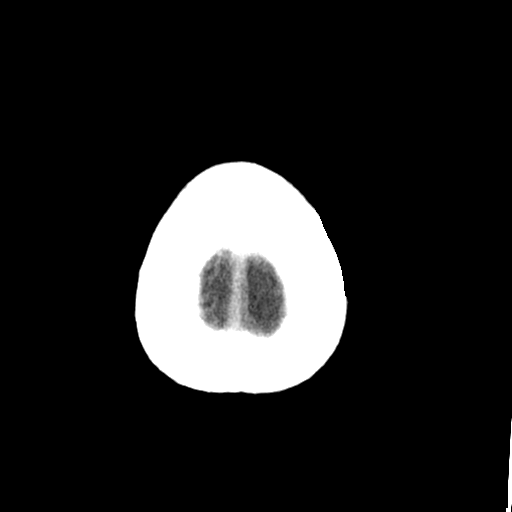
[im 29/32  brain]
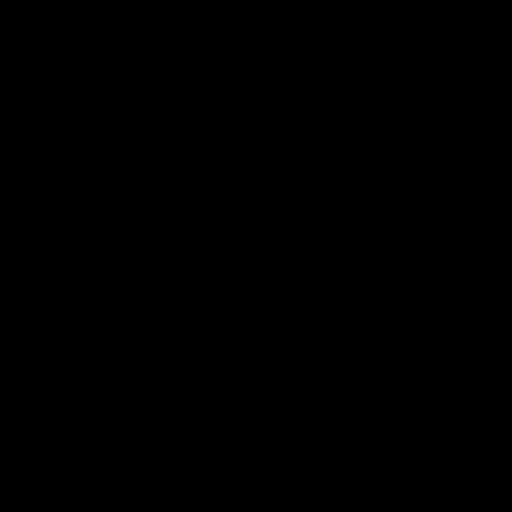
[im 29/32  bone]
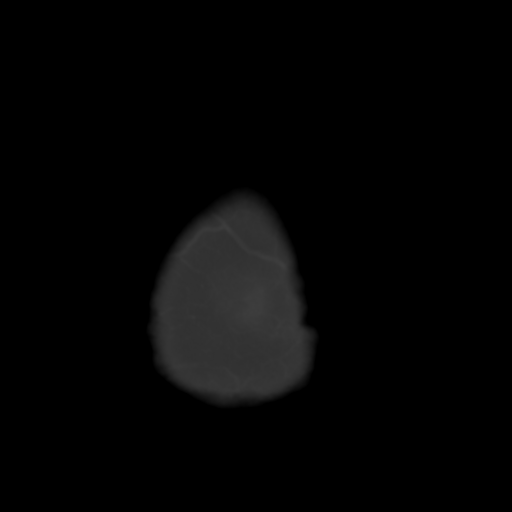

[Series 4: head 3.0 cor st · coronal · 0.28mm/px · 3 of 65 slices shown]
[im 22/65  brain]
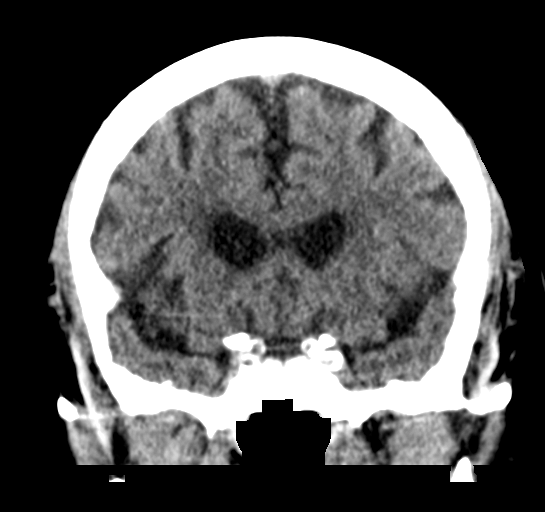
[im 29/65  brain]
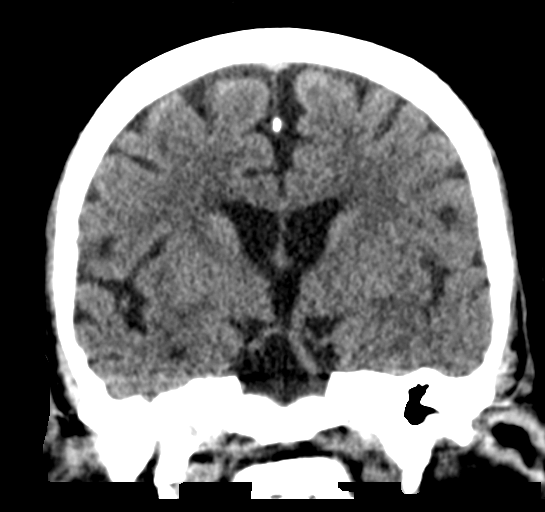
[im 36/65  brain]
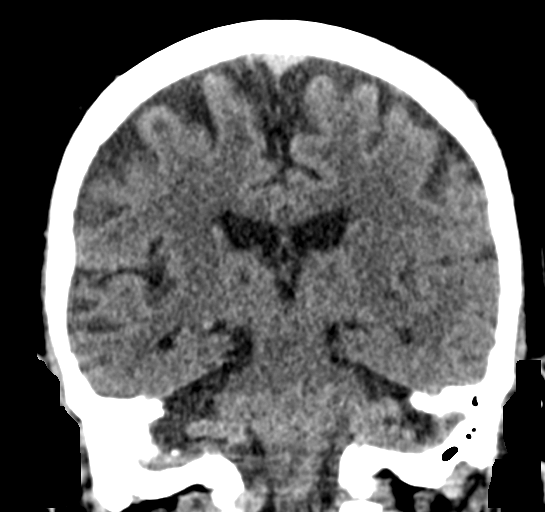

[Series 5: head 3.0 sag st · sagittal · 0.31mm/px · 3 of 67 slices shown]
[im 23/67  brain]
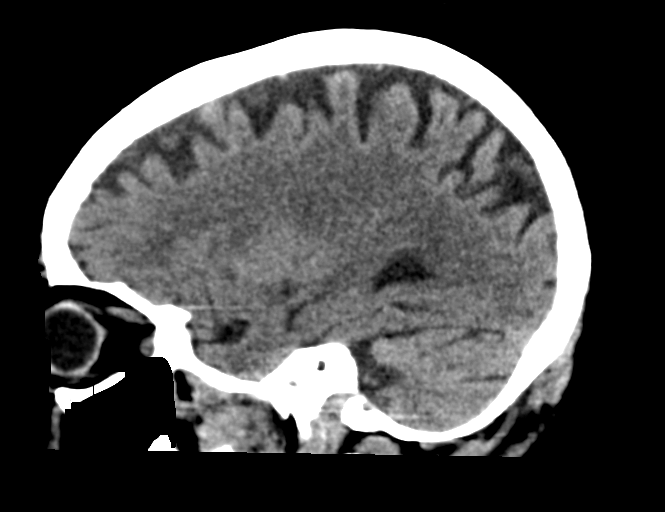
[im 34/67  brain]
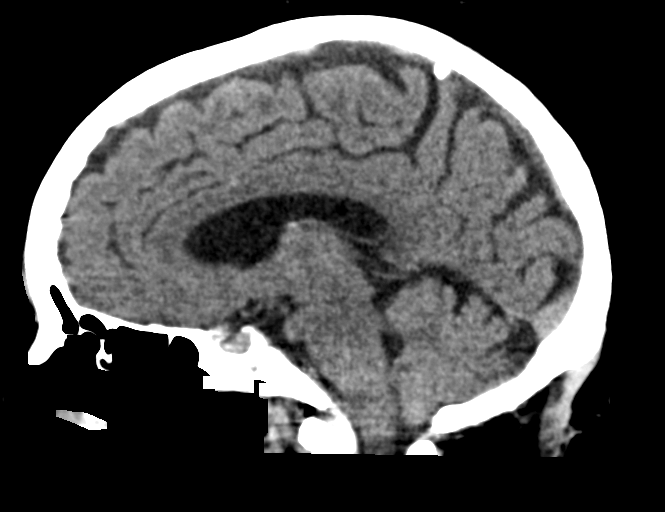
[im 45/67  brain]
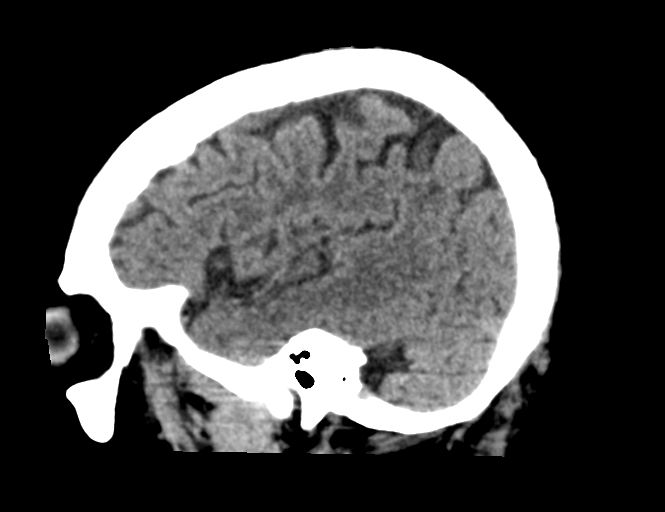

[15 of 47 positions shown; findings below may reference images not displayed]

FINDINGS: Brain: SUBTLE SIGNS OF ASYMMETRIC HYPOATTENUATION LEFT insula and
LEFT anterior temporal lobe, concerning for early cytotoxic edema.
Atrophy and small vessel disease. No hemorrhage, mass lesion,
hydrocephalus, or extra-axial fluid.

Vascular: Possible hyperdense LEFT MCA involving distal M1 segment,
see image 10 series 2 and image 27 series 4.

Skull: Normal. Negative for fracture or focal lesion.

Sinuses/Orbits: No acute finding.

Other: None.

ASPECTS ([place] Stroke Program Early CT Score)

- Ganglionic level infarction (caudate, lentiform nuclei, internal
capsule, insula, M1-M3 cortex): 5, due to suspected hypodensity of
the insula and M2 cortex.

- Supraganglionic infarction (M4-M6 cortex): 3

Total score (0-10 with 10 being normal): 8.

Findings appear to represent interval change from priors.
IMPRESSION: 1. Concern for hyperdense distal LEFT MCA M1 segment suggesting
thrombus.

Hypoattenuation LEFT insula and LEFT anterior temporal lobe,
possible early infarction
2. ASPECTS is 8.

These results were called by telephone at the time of interpretation
on 06/06/2016 at [DATE] to Dr. ISLANDE, who verbally acknowledged
these results.
